# Patient Record
Sex: Female | Born: 1940
Health system: Southern US, Community
[De-identification: ages and names within clinical notes are randomized; demographics above are authoritative.]

## PROBLEM LIST (undated history)

## (undated) DIAGNOSIS — M1990 Unspecified osteoarthritis, unspecified site: Secondary | ICD-10-CM

## (undated) HISTORY — DX: Unspecified osteoarthritis, unspecified site: M19.90

---

## 1997-07-02 ENCOUNTER — Other Ambulatory Visit: Admission: RE | Admit: 1997-07-02 | Discharge: 1997-07-02 | Payer: Self-pay | Admitting: Gastroenterology

## 1997-07-28 ENCOUNTER — Other Ambulatory Visit: Admission: RE | Admit: 1997-07-28 | Discharge: 1997-07-28 | Payer: Self-pay | Admitting: Gastroenterology

## 1997-07-29 ENCOUNTER — Other Ambulatory Visit: Admission: RE | Admit: 1997-07-29 | Discharge: 1997-07-29 | Payer: Self-pay | Admitting: Gastroenterology

## 1997-08-07 ENCOUNTER — Ambulatory Visit (HOSPITAL_COMMUNITY): Admission: RE | Admit: 1997-08-07 | Discharge: 1997-08-07 | Payer: Self-pay | Admitting: Gastroenterology

## 1997-08-11 ENCOUNTER — Other Ambulatory Visit: Admission: RE | Admit: 1997-08-11 | Discharge: 1997-08-11 | Payer: Self-pay | Admitting: Family Medicine

## 1997-08-12 ENCOUNTER — Other Ambulatory Visit: Admission: RE | Admit: 1997-08-12 | Discharge: 1997-08-12 | Payer: Self-pay | Admitting: Gastroenterology

## 1997-08-25 ENCOUNTER — Other Ambulatory Visit: Admission: RE | Admit: 1997-08-25 | Discharge: 1997-08-25 | Payer: Self-pay | Admitting: Gastroenterology

## 1997-09-22 ENCOUNTER — Other Ambulatory Visit: Admission: RE | Admit: 1997-09-22 | Discharge: 1997-09-22 | Payer: Self-pay | Admitting: Gastroenterology

## 1997-10-20 ENCOUNTER — Other Ambulatory Visit: Admission: RE | Admit: 1997-10-20 | Discharge: 1997-10-20 | Payer: Self-pay | Admitting: Gastroenterology

## 1997-11-20 ENCOUNTER — Ambulatory Visit (HOSPITAL_COMMUNITY): Admission: RE | Admit: 1997-11-20 | Discharge: 1997-11-20 | Payer: Self-pay | Admitting: Gastroenterology

## 1997-11-20 ENCOUNTER — Encounter: Payer: Self-pay | Admitting: Gastroenterology

## 1998-08-19 ENCOUNTER — Encounter: Payer: Self-pay | Admitting: Family Medicine

## 1998-08-19 ENCOUNTER — Ambulatory Visit (HOSPITAL_COMMUNITY): Admission: RE | Admit: 1998-08-19 | Discharge: 1998-08-19 | Payer: Self-pay | Admitting: Family Medicine

## 1998-09-28 ENCOUNTER — Other Ambulatory Visit: Admission: RE | Admit: 1998-09-28 | Discharge: 1998-09-28 | Payer: Self-pay | Admitting: Family Medicine

## 1998-10-25 ENCOUNTER — Ambulatory Visit (HOSPITAL_COMMUNITY): Admission: RE | Admit: 1998-10-25 | Discharge: 1998-10-25 | Payer: Self-pay | Admitting: Family Medicine

## 1999-07-14 ENCOUNTER — Encounter: Payer: Self-pay | Admitting: Gastroenterology

## 1999-07-14 ENCOUNTER — Encounter: Admission: RE | Admit: 1999-07-14 | Discharge: 1999-07-14 | Payer: Self-pay | Admitting: Gastroenterology

## 1999-10-27 ENCOUNTER — Encounter: Payer: Self-pay | Admitting: Family Medicine

## 1999-10-27 ENCOUNTER — Ambulatory Visit (HOSPITAL_COMMUNITY): Admission: RE | Admit: 1999-10-27 | Discharge: 1999-10-27 | Payer: Self-pay | Admitting: Family Medicine

## 2001-05-05 ENCOUNTER — Encounter: Payer: Self-pay | Admitting: *Deleted

## 2001-05-05 ENCOUNTER — Ambulatory Visit (HOSPITAL_COMMUNITY): Admission: RE | Admit: 2001-05-05 | Discharge: 2001-05-05 | Payer: Self-pay | Admitting: *Deleted

## 2001-06-07 ENCOUNTER — Ambulatory Visit (HOSPITAL_COMMUNITY): Admission: RE | Admit: 2001-06-07 | Discharge: 2001-06-07 | Payer: Self-pay | Admitting: Family Medicine

## 2001-07-17 ENCOUNTER — Ambulatory Visit (HOSPITAL_COMMUNITY): Admission: RE | Admit: 2001-07-17 | Discharge: 2001-07-17 | Payer: Self-pay | Admitting: Gastroenterology

## 2001-07-17 ENCOUNTER — Encounter (INDEPENDENT_AMBULATORY_CARE_PROVIDER_SITE_OTHER): Payer: Self-pay | Admitting: *Deleted

## 2002-10-20 ENCOUNTER — Other Ambulatory Visit: Admission: RE | Admit: 2002-10-20 | Discharge: 2002-10-20 | Payer: Self-pay | Admitting: Family Medicine

## 2004-02-24 ENCOUNTER — Ambulatory Visit: Payer: Self-pay | Admitting: Internal Medicine

## 2004-03-04 ENCOUNTER — Ambulatory Visit: Payer: Self-pay | Admitting: Internal Medicine

## 2004-03-09 ENCOUNTER — Ambulatory Visit (HOSPITAL_COMMUNITY): Admission: RE | Admit: 2004-03-09 | Discharge: 2004-03-09 | Payer: Self-pay | Admitting: Internal Medicine

## 2004-03-14 ENCOUNTER — Ambulatory Visit: Payer: Self-pay | Admitting: Internal Medicine

## 2004-07-20 ENCOUNTER — Ambulatory Visit: Payer: Self-pay | Admitting: Family Medicine

## 2004-08-12 ENCOUNTER — Ambulatory Visit: Payer: Self-pay | Admitting: Family Medicine

## 2004-11-09 ENCOUNTER — Ambulatory Visit: Payer: Self-pay | Admitting: Internal Medicine

## 2004-11-30 ENCOUNTER — Ambulatory Visit: Payer: Self-pay | Admitting: Internal Medicine

## 2004-12-20 ENCOUNTER — Ambulatory Visit: Payer: Self-pay | Admitting: Internal Medicine

## 2004-12-21 ENCOUNTER — Ambulatory Visit (HOSPITAL_COMMUNITY): Admission: RE | Admit: 2004-12-21 | Discharge: 2004-12-21 | Payer: Self-pay | Admitting: Internal Medicine

## 2005-04-15 ENCOUNTER — Emergency Department (HOSPITAL_COMMUNITY): Admission: EM | Admit: 2005-04-15 | Discharge: 2005-04-15 | Payer: Self-pay | Admitting: Family Medicine

## 2005-05-02 ENCOUNTER — Ambulatory Visit: Payer: Self-pay | Admitting: Family Medicine

## 2005-06-03 ENCOUNTER — Encounter (INDEPENDENT_AMBULATORY_CARE_PROVIDER_SITE_OTHER): Payer: Self-pay | Admitting: Nurse Practitioner

## 2005-06-03 LAB — CONVERTED CEMR LAB

## 2005-06-20 ENCOUNTER — Encounter (INDEPENDENT_AMBULATORY_CARE_PROVIDER_SITE_OTHER): Payer: Self-pay | Admitting: Family Medicine

## 2005-06-20 ENCOUNTER — Other Ambulatory Visit: Admission: RE | Admit: 2005-06-20 | Discharge: 2005-06-20 | Payer: Self-pay | Admitting: Family Medicine

## 2005-06-20 ENCOUNTER — Ambulatory Visit: Payer: Self-pay | Admitting: Family Medicine

## 2005-06-22 ENCOUNTER — Ambulatory Visit (HOSPITAL_COMMUNITY): Admission: RE | Admit: 2005-06-22 | Discharge: 2005-06-22 | Payer: Self-pay | Admitting: Family Medicine

## 2006-02-09 ENCOUNTER — Ambulatory Visit: Payer: Self-pay | Admitting: Internal Medicine

## 2006-02-13 ENCOUNTER — Ambulatory Visit: Payer: Self-pay | Admitting: *Deleted

## 2006-04-02 ENCOUNTER — Ambulatory Visit: Payer: Self-pay | Admitting: Family Medicine

## 2006-04-04 ENCOUNTER — Ambulatory Visit (HOSPITAL_COMMUNITY): Admission: RE | Admit: 2006-04-04 | Discharge: 2006-04-04 | Payer: Self-pay | Admitting: Family Medicine

## 2006-04-10 ENCOUNTER — Ambulatory Visit (HOSPITAL_COMMUNITY): Admission: RE | Admit: 2006-04-10 | Discharge: 2006-04-10 | Payer: Self-pay | Admitting: Family Medicine

## 2006-04-10 ENCOUNTER — Encounter (INDEPENDENT_AMBULATORY_CARE_PROVIDER_SITE_OTHER): Payer: Self-pay | Admitting: Cardiology

## 2006-04-10 ENCOUNTER — Encounter (INDEPENDENT_AMBULATORY_CARE_PROVIDER_SITE_OTHER): Payer: Self-pay | Admitting: *Deleted

## 2006-04-16 ENCOUNTER — Ambulatory Visit: Payer: Self-pay | Admitting: Family Medicine

## 2006-04-18 ENCOUNTER — Ambulatory Visit: Payer: Self-pay | Admitting: *Deleted

## 2006-06-04 ENCOUNTER — Ambulatory Visit: Payer: Self-pay | Admitting: Family Medicine

## 2006-07-17 ENCOUNTER — Ambulatory Visit (HOSPITAL_COMMUNITY): Admission: RE | Admit: 2006-07-17 | Discharge: 2006-07-17 | Payer: Self-pay | Admitting: Internal Medicine

## 2006-10-17 ENCOUNTER — Ambulatory Visit: Payer: Self-pay | Admitting: Family Medicine

## 2006-12-19 ENCOUNTER — Encounter (INDEPENDENT_AMBULATORY_CARE_PROVIDER_SITE_OTHER): Payer: Self-pay | Admitting: *Deleted

## 2006-12-24 ENCOUNTER — Encounter (INDEPENDENT_AMBULATORY_CARE_PROVIDER_SITE_OTHER): Payer: Self-pay | Admitting: Nurse Practitioner

## 2006-12-24 DIAGNOSIS — M25569 Pain in unspecified knee: Secondary | ICD-10-CM

## 2006-12-24 DIAGNOSIS — K589 Irritable bowel syndrome without diarrhea: Secondary | ICD-10-CM | POA: Insufficient documentation

## 2006-12-24 DIAGNOSIS — K219 Gastro-esophageal reflux disease without esophagitis: Secondary | ICD-10-CM

## 2006-12-24 DIAGNOSIS — H53469 Homonymous bilateral field defects, unspecified side: Secondary | ICD-10-CM

## 2006-12-24 DIAGNOSIS — B182 Chronic viral hepatitis C: Secondary | ICD-10-CM | POA: Insufficient documentation

## 2007-03-08 ENCOUNTER — Ambulatory Visit: Payer: Self-pay | Admitting: Nurse Practitioner

## 2007-03-08 DIAGNOSIS — B373 Candidiasis of vulva and vagina: Secondary | ICD-10-CM

## 2007-03-08 DIAGNOSIS — M940 Chondrocostal junction syndrome [Tietze]: Secondary | ICD-10-CM | POA: Insufficient documentation

## 2007-03-08 LAB — CONVERTED CEMR LAB
ALT: 31 units/L (ref 0–35)
Albumin: 4.3 g/dL (ref 3.5–5.2)
BUN: 15 mg/dL (ref 6–23)
Basophils Absolute: 0 10*3/uL (ref 0.0–0.1)
Chloride: 108 meq/L (ref 96–112)
Creatinine, Ser: 0.79 mg/dL (ref 0.40–1.20)
Eosinophils Absolute: 0.2 10*3/uL (ref 0.2–0.7)
LDL Cholesterol: 126 mg/dL — ABNORMAL HIGH (ref 0–99)
Lymphocytes Relative: 37 % (ref 12–46)
MCHC: 32.2 g/dL (ref 30.0–36.0)
Monocytes Absolute: 0.3 10*3/uL (ref 0.1–1.0)
Neutro Abs: 1.9 10*3/uL (ref 1.7–7.7)
Neutrophils Relative %: 49 % (ref 43–77)
Platelets: 171 10*3/uL (ref 150–400)
Potassium: 4.8 meq/L (ref 3.5–5.3)
RBC: 5.11 M/uL (ref 3.87–5.11)
Total Bilirubin: 0.8 mg/dL (ref 0.3–1.2)
Total Protein: 7.8 g/dL (ref 6.0–8.3)
Triglycerides: 81 mg/dL (ref <150)
WBC: 3.8 10*3/uL — ABNORMAL LOW (ref 4.0–10.5)

## 2007-04-30 ENCOUNTER — Encounter (INDEPENDENT_AMBULATORY_CARE_PROVIDER_SITE_OTHER): Payer: Self-pay | Admitting: Family Medicine

## 2007-04-30 ENCOUNTER — Other Ambulatory Visit: Admission: RE | Admit: 2007-04-30 | Discharge: 2007-04-30 | Payer: Self-pay | Admitting: Family Medicine

## 2007-04-30 ENCOUNTER — Ambulatory Visit: Payer: Self-pay | Admitting: Family Medicine

## 2007-04-30 DIAGNOSIS — H6060 Unspecified chronic otitis externa, unspecified ear: Secondary | ICD-10-CM

## 2007-04-30 LAB — CONVERTED CEMR LAB
Ketones, urine, test strip: NEGATIVE
Nitrite: NEGATIVE
Protein, U semiquant: NEGATIVE
Specific Gravity, Urine: <1.005
Urobilinogen, UA: 0.2
pH: 6.5

## 2007-05-02 LAB — CONVERTED CEMR LAB: Pap Smear: NORMAL

## 2007-05-06 ENCOUNTER — Ambulatory Visit (HOSPITAL_COMMUNITY): Admission: RE | Admit: 2007-05-06 | Discharge: 2007-05-06 | Payer: Self-pay | Admitting: Family Medicine

## 2007-05-06 ENCOUNTER — Encounter (INDEPENDENT_AMBULATORY_CARE_PROVIDER_SITE_OTHER): Payer: Self-pay | Admitting: Family Medicine

## 2007-05-21 ENCOUNTER — Encounter (INDEPENDENT_AMBULATORY_CARE_PROVIDER_SITE_OTHER): Payer: Self-pay | Admitting: Family Medicine

## 2007-05-21 DIAGNOSIS — K921 Melena: Secondary | ICD-10-CM | POA: Insufficient documentation

## 2007-05-31 ENCOUNTER — Telehealth (INDEPENDENT_AMBULATORY_CARE_PROVIDER_SITE_OTHER): Payer: Self-pay | Admitting: Family Medicine

## 2007-06-12 ENCOUNTER — Ambulatory Visit: Payer: Self-pay | Admitting: Family Medicine

## 2007-06-12 DIAGNOSIS — M810 Age-related osteoporosis without current pathological fracture: Secondary | ICD-10-CM | POA: Insufficient documentation

## 2007-06-19 ENCOUNTER — Encounter (INDEPENDENT_AMBULATORY_CARE_PROVIDER_SITE_OTHER): Payer: Self-pay | Admitting: Family Medicine

## 2007-07-22 ENCOUNTER — Ambulatory Visit (HOSPITAL_COMMUNITY): Admission: RE | Admit: 2007-07-22 | Discharge: 2007-07-22 | Payer: Self-pay | Admitting: Family Medicine

## 2007-11-05 ENCOUNTER — Ambulatory Visit: Payer: Self-pay | Admitting: Internal Medicine

## 2007-11-05 DIAGNOSIS — M6740 Ganglion, unspecified site: Secondary | ICD-10-CM

## 2007-11-05 DIAGNOSIS — M25519 Pain in unspecified shoulder: Secondary | ICD-10-CM | POA: Insufficient documentation

## 2007-11-05 DIAGNOSIS — R002 Palpitations: Secondary | ICD-10-CM | POA: Insufficient documentation

## 2007-11-08 ENCOUNTER — Encounter (INDEPENDENT_AMBULATORY_CARE_PROVIDER_SITE_OTHER): Payer: Self-pay | Admitting: Internal Medicine

## 2007-12-11 ENCOUNTER — Telehealth (INDEPENDENT_AMBULATORY_CARE_PROVIDER_SITE_OTHER): Payer: Self-pay | Admitting: *Deleted

## 2008-01-16 ENCOUNTER — Ambulatory Visit: Payer: Self-pay | Admitting: Internal Medicine

## 2008-01-20 ENCOUNTER — Encounter (INDEPENDENT_AMBULATORY_CARE_PROVIDER_SITE_OTHER): Payer: Self-pay | Admitting: *Deleted

## 2008-01-20 LAB — CONVERTED CEMR LAB
Free T4: 1.32 ng/dL (ref 0.89–1.80)
T3, Free: 3 pg/mL (ref 2.3–4.2)

## 2008-02-11 ENCOUNTER — Ambulatory Visit: Payer: Self-pay | Admitting: Family Medicine

## 2008-08-24 ENCOUNTER — Ambulatory Visit: Payer: Self-pay | Admitting: Family Medicine

## 2008-08-24 DIAGNOSIS — S93409A Sprain of unspecified ligament of unspecified ankle, initial encounter: Secondary | ICD-10-CM | POA: Insufficient documentation

## 2008-08-24 DIAGNOSIS — S336XXA Sprain of sacroiliac joint, initial encounter: Secondary | ICD-10-CM | POA: Insufficient documentation

## 2008-08-24 DIAGNOSIS — H60339 Swimmer's ear, unspecified ear: Secondary | ICD-10-CM | POA: Insufficient documentation

## 2008-08-24 DIAGNOSIS — R358 Other polyuria: Secondary | ICD-10-CM

## 2008-08-24 LAB — CONVERTED CEMR LAB
Bilirubin Urine: NEGATIVE
Blood in Urine, dipstick: NEGATIVE
Protein, U semiquant: NEGATIVE
Urobilinogen, UA: 0.2

## 2008-08-31 ENCOUNTER — Telehealth (INDEPENDENT_AMBULATORY_CARE_PROVIDER_SITE_OTHER): Payer: Self-pay | Admitting: *Deleted

## 2008-09-09 ENCOUNTER — Encounter (INDEPENDENT_AMBULATORY_CARE_PROVIDER_SITE_OTHER): Payer: Self-pay | Admitting: Nurse Practitioner

## 2008-09-10 ENCOUNTER — Encounter (INDEPENDENT_AMBULATORY_CARE_PROVIDER_SITE_OTHER): Payer: Self-pay | Admitting: Internal Medicine

## 2008-09-22 ENCOUNTER — Encounter (INDEPENDENT_AMBULATORY_CARE_PROVIDER_SITE_OTHER): Payer: Self-pay | Admitting: Internal Medicine

## 2008-10-01 ENCOUNTER — Encounter (INDEPENDENT_AMBULATORY_CARE_PROVIDER_SITE_OTHER): Payer: Self-pay | Admitting: Nurse Practitioner

## 2008-12-28 ENCOUNTER — Encounter (INDEPENDENT_AMBULATORY_CARE_PROVIDER_SITE_OTHER): Payer: Self-pay | Admitting: Nurse Practitioner

## 2009-01-25 ENCOUNTER — Telehealth (INDEPENDENT_AMBULATORY_CARE_PROVIDER_SITE_OTHER): Payer: Self-pay | Admitting: Nurse Practitioner

## 2009-02-16 ENCOUNTER — Encounter (INDEPENDENT_AMBULATORY_CARE_PROVIDER_SITE_OTHER): Payer: Self-pay | Admitting: Nurse Practitioner

## 2009-02-19 ENCOUNTER — Ambulatory Visit: Payer: Self-pay | Admitting: Internal Medicine

## 2009-02-19 DIAGNOSIS — R3915 Urgency of urination: Secondary | ICD-10-CM

## 2009-02-19 LAB — CONVERTED CEMR LAB
Bilirubin Urine: NEGATIVE
Blood in Urine, dipstick: NEGATIVE
Ketones, urine, test strip: NEGATIVE
Nitrite: NEGATIVE
Protein, U semiquant: NEGATIVE
Specific Gravity, Urine: <1.005
Urobilinogen, UA: 0.2
pH: 6

## 2009-02-20 ENCOUNTER — Encounter (INDEPENDENT_AMBULATORY_CARE_PROVIDER_SITE_OTHER): Payer: Self-pay | Admitting: Internal Medicine

## 2009-05-21 ENCOUNTER — Ambulatory Visit: Payer: Self-pay | Admitting: Internal Medicine

## 2009-05-21 DIAGNOSIS — K5900 Constipation, unspecified: Secondary | ICD-10-CM | POA: Insufficient documentation

## 2009-05-21 LAB — CONVERTED CEMR LAB
Glucose, Urine, Semiquant: NEGATIVE
Urobilinogen, UA: 0.2

## 2009-08-17 ENCOUNTER — Ambulatory Visit: Payer: Self-pay | Admitting: Internal Medicine

## 2009-08-17 DIAGNOSIS — R269 Unspecified abnormalities of gait and mobility: Secondary | ICD-10-CM

## 2009-08-28 ENCOUNTER — Telehealth (INDEPENDENT_AMBULATORY_CARE_PROVIDER_SITE_OTHER): Payer: Self-pay | Admitting: Internal Medicine

## 2009-08-28 ENCOUNTER — Encounter (INDEPENDENT_AMBULATORY_CARE_PROVIDER_SITE_OTHER): Payer: Self-pay | Admitting: Internal Medicine

## 2009-09-01 ENCOUNTER — Encounter (INDEPENDENT_AMBULATORY_CARE_PROVIDER_SITE_OTHER): Payer: Self-pay | Admitting: Internal Medicine

## 2009-09-30 ENCOUNTER — Encounter (INDEPENDENT_AMBULATORY_CARE_PROVIDER_SITE_OTHER): Payer: Self-pay | Admitting: *Deleted

## 2009-10-27 ENCOUNTER — Ambulatory Visit: Payer: Self-pay | Admitting: Gastroenterology

## 2009-10-27 ENCOUNTER — Telehealth (INDEPENDENT_AMBULATORY_CARE_PROVIDER_SITE_OTHER): Payer: Self-pay | Admitting: Internal Medicine

## 2009-11-01 ENCOUNTER — Ambulatory Visit: Payer: Self-pay | Admitting: Gastroenterology

## 2009-11-04 ENCOUNTER — Encounter: Payer: Self-pay | Admitting: Gastroenterology

## 2009-12-02 ENCOUNTER — Ambulatory Visit: Payer: Self-pay | Admitting: Internal Medicine

## 2009-12-02 DIAGNOSIS — M25529 Pain in unspecified elbow: Secondary | ICD-10-CM | POA: Insufficient documentation

## 2009-12-10 ENCOUNTER — Ambulatory Visit: Payer: Self-pay | Admitting: Gastroenterology

## 2009-12-28 ENCOUNTER — Encounter
Admission: RE | Admit: 2009-12-28 | Discharge: 2010-03-04 | Payer: Self-pay | Source: Home / Self Care | Admitting: Internal Medicine

## 2009-12-31 ENCOUNTER — Encounter (INDEPENDENT_AMBULATORY_CARE_PROVIDER_SITE_OTHER): Payer: Self-pay | Admitting: Internal Medicine

## 2010-01-11 ENCOUNTER — Ambulatory Visit: Payer: Self-pay | Admitting: Gastroenterology

## 2010-01-28 ENCOUNTER — Telehealth: Payer: Self-pay | Admitting: Gastroenterology

## 2010-02-01 ENCOUNTER — Telehealth: Payer: Self-pay | Admitting: Gastroenterology

## 2010-02-07 ENCOUNTER — Encounter (INDEPENDENT_AMBULATORY_CARE_PROVIDER_SITE_OTHER): Payer: Self-pay | Admitting: Internal Medicine

## 2010-03-04 ENCOUNTER — Encounter (INDEPENDENT_AMBULATORY_CARE_PROVIDER_SITE_OTHER): Payer: Self-pay | Admitting: Internal Medicine

## 2010-03-14 ENCOUNTER — Encounter (INDEPENDENT_AMBULATORY_CARE_PROVIDER_SITE_OTHER): Payer: Self-pay | Admitting: Internal Medicine

## 2010-03-22 ENCOUNTER — Telehealth (INDEPENDENT_AMBULATORY_CARE_PROVIDER_SITE_OTHER): Payer: Self-pay | Admitting: Internal Medicine

## 2010-04-05 ENCOUNTER — Ambulatory Visit
Admission: RE | Admit: 2010-04-05 | Discharge: 2010-04-05 | Payer: Self-pay | Source: Home / Self Care | Attending: Internal Medicine | Admitting: Internal Medicine

## 2010-04-05 DIAGNOSIS — R071 Chest pain on breathing: Secondary | ICD-10-CM | POA: Insufficient documentation

## 2010-04-05 DIAGNOSIS — M549 Dorsalgia, unspecified: Secondary | ICD-10-CM | POA: Insufficient documentation

## 2010-04-07 ENCOUNTER — Telehealth (INDEPENDENT_AMBULATORY_CARE_PROVIDER_SITE_OTHER): Payer: Self-pay | Admitting: Internal Medicine

## 2010-05-01 LAB — CONVERTED CEMR LAB
Albumin: 4.3 g/dL (ref 3.5–5.2)
Alkaline Phosphatase: 46 units/L (ref 39–117)
Basophils Relative: 0 % (ref 0–1)
CO2: 25 meq/L (ref 19–32)
Calcium: 9 mg/dL (ref 8.4–10.5)
Creatinine, Ser: 0.87 mg/dL (ref 0.40–1.20)
Eosinophils Absolute: 0.2 10*3/uL (ref 0.0–0.7)
Eosinophils Relative: 4 % (ref 0–5)
Glucose, Bld: 75 mg/dL (ref 70–99)
Hemoglobin: 14.5 g/dL (ref 12.0–15.0)
Lymphocytes Relative: 41 % (ref 12–46)
Lymphs Abs: 2.1 10*3/uL (ref 0.7–4.0)
MCV: 85.3 fL (ref 78.0–100.0)
Monocytes Relative: 11 % (ref 3–12)
Neutro Abs: 2.3 10*3/uL (ref 1.7–7.7)
Potassium: 4.7 meq/L (ref 3.5–5.3)
RDW: 13.1 % (ref 11.5–15.5)
TSH: 5.885 microintl units/mL — ABNORMAL HIGH (ref 0.350–4.50)
Total Bilirubin: 0.5 mg/dL (ref 0.3–1.2)
Total Protein: 7.5 g/dL (ref 6.0–8.3)
WBC: 5.2 10*3/uL (ref 4.0–10.5)

## 2010-05-03 ENCOUNTER — Encounter
Admission: RE | Admit: 2010-05-03 | Discharge: 2010-05-03 | Payer: Self-pay | Source: Home / Self Care | Attending: Internal Medicine | Admitting: Internal Medicine

## 2010-05-03 NOTE — Procedures (Signed)
Summary: Upper Endoscopy  Patient: Karen Wilson Note: All result statuses are Final unless otherwise noted.  Tests: (1) Upper Endoscopy (EGD)   EGD Upper Endoscopy       DONE     [facility]     [address]     [place], [place]  [address]           ENDOSCOPY PROCEDURE REPORT           PATIENT:  Karen Wilson, Karen Wilson  MR#:  [id number]     BIRTHDATE:  10/03/1940, 69 yrs. old  GENDER:  female           ENDOSCOPIST:  Barbette Hair. Arlyce Dice, MD     Referred by:           PROCEDURE DATE:  11/01/2009     PROCEDURE:  EGD with biopsy     ASA CLASS:  Class II     INDICATIONS:  reflux symptoms despite therapy           MEDICATIONS:   Fentanyl 75 mcg IV, Versed 10 mg IV, glycopyrrolate     (Robinal) 0.2 mg IV, 0.6cc simethancone 0.6 cc PO     TOPICAL ANESTHETIC:  Exactacain Spray           DESCRIPTION OF PROCEDURE:   After the risks benefits and     alternatives of the procedure were thoroughly explained, informed     consent was obtained.  The LB GIF-H180 [id number] endoscope was     introduced through the mouth and advanced to the third portion of     the duodenum, without limitations.  The instrument was slowly     withdrawn as the mucosa was fully examined.     <<PROCEDUREIMAGES>>           irregular Z-line at the gastroesophageal junction. Z line at 36cm     from incisors, irregular (see image1, image2, and image3). Bxs     taken to r/o Barrett's esophagus  Otherwise the examination was     normal.    Retroflexed views revealed no abnormalities.    The     scope was then withdrawn from the patient and the procedure     completed.           COMPLICATIONS:  None           ENDOSCOPIC IMPRESSION:     1) Irregular Z-line at the gastroesophageal junction     2) Otherwise normal examination     RECOMMENDATIONS:     1) Call office next 2-3 days to schedule an office appointment     for 1 month     2) dexilant 60mg  daily           REPEAT EXAM:  Pending biopsy results          ______________________________     Barbette Hair. Arlyce Dice, MD           CC:  Julieanne Manson, MD           n.     Rosalie DoctorBarbette Hair. Mervin Ramires at 11/01/2009 04:22 PM           Ochs, Marko Stai, [phone]  Note: An exclamation mark (!) indicates a result that was not dispersed into the flowsheet. Document Creation Date: 11/01/2009 4:22 PM _______________________________________________________________________  (1) Order result status: Final Collection or observation date-time: 11/01/2009 16:14 Requested date-time:  Receipt date-time:  Reported date-time:  Referring Physician:   Ordering Physician: Molly Maduro  Arlyce Dice [phone]) Specimen Source:  Source: Launa Grill Order Number: [id number] Lab site:

## 2010-05-03 NOTE — Procedures (Signed)
Summary: Colonoscopy  Patient: [name] Note: All result statuses are Final unless otherwise noted.  Tests: (1) Colonoscopy (COL)   COL Colonoscopy           DONE     [facility]     [address]     [place], [place]  [address]           COLONOSCOPY PROCEDURE REPORT           PATIENT:  Karen Wilson, Karen Wilson  MR#:  [id number]     BIRTHDATE:  [birth year], 69 yrs. old  GENDER:  female           ENDOSCOPIST:  Barbette Hair. Arlyce Dice, MD     Referred by:           PROCEDURE DATE:  12/10/2009     PROCEDURE:  Diagnostic Colonoscopy     ASA CLASS:  Class II     INDICATIONS:  1) Routine Risk Screening           MEDICATIONS:   Fentanyl 75 mcg IV, Versed 7 mg IV           DESCRIPTION OF PROCEDURE:   After the risks benefits and     alternatives of the procedure were thoroughly explained, informed     consent was obtained.  Digital rectal exam was performed and     revealed no abnormalities.   The LB CF-H180AL [id number] endoscope     was introduced through the anus and advanced to the cecum, which     was identified by both the appendix and ileocecal valve, without     limitations.  The quality of the prep was excellent, using     MoviPrep.  The instrument was then slowly withdrawn as the colon     was fully examined.     <<PROCEDUREIMAGES>>           FINDINGS:  A normal appearing cecum, ileocecal valve, and     appendiceal orifice were identified. The ascending, hepatic     flexure, transverse, splenic flexure, descending, sigmoid colon,     and rectum appeared unremarkable (see image1, image2, image4,     image5, image8, image9, image11, and image12).   Retroflexed views     in the rectum revealed no abnormalities.    The time to cecum =     5.75  minutes. The scope was then withdrawn (time =  6.25  min)     from the patient and the procedure completed.           COMPLICATIONS:  None           ENDOSCOPIC IMPRESSION:     1) Normal colon     RECOMMENDATIONS:     1)  Continue current colorectal screening recommendations for     "routine risk" patients with a repeat colonoscopy in 10 years.           REPEAT EXAM:  In 10 year(s) for Colonoscopy.           ______________________________     Barbette Hair. Arlyce Dice, MD           CC: Julieanne Manson, MD           n.     Rosalie DoctorBarbette Hair. Haasini Patnaude at 12/10/2009 02:15 PM           Tyree, Marko Stai, [id number]  Note: An exclamation mark (!) indicates a result that was not dispersed into the flowsheet. Document  Creation Date: 12/10/2009 2:16 PM _______________________________________________________________________  (1) Order result status: Final Collection or observation date-time: 12/10/2009 14:08 Requested date-time:  Receipt date-time:  Reported date-time:  Referring Physician:   Ordering Physician: Melvia Heaps [phone]) Specimen Source:  Source: Launa Grill Order Number: [id number] Lab site:   Appended Document: Colonoscopy    Clinical Lists Changes  Observations: Added new observation of COLONNXTDUE: 12/2019 (12/10/2009 14:35)

## 2010-05-03 NOTE — Miscellaneous (Addendum)
Summary: Rehab Report//initial summary  Rehab Report//initial summary   Imported By: Arta Bruce 12/31/2009 14:07:07  _____________________________________________________________________  External Attachments:     1. Type:   Image          Comment:   External Document    2. Type:   Image          Comment:   External Document

## 2010-05-03 NOTE — Letter (Signed)
Summary: New Patient letter  [name]  [address] [place], [place] [address]   Phone: [phone]  Fax: [phone]       09/30/2009 MRN: [id number]  Karen Wilson [address] [place], [place]  [address]  Dear Karen Wilson,  Welcome to the Gastroenterology Division at [facility].    You are scheduled to see Dr.  Melvia Heaps on October 25, 2009 at 10:45am on the 3rd floor at [facility], [address]  We ask that you try to arrive at our office 15 minutes prior to your appointment time to allow for check-in.  We would like you to complete the enclosed self-administered evaluation form prior to your visit and bring it with you on the day of your appointment.  We will review it with you.  Also, please bring a complete list of all your medications or, if you prefer, bring the medication bottles and we will list them.  Please bring your insurance card so that we may make a copy of it.  If your insurance requires a referral to see a specialist, please bring your referral form from your primary care physician.  Co-payments are due at the time of your visit and may be paid by cash, check or credit card.     Your office visit will consist of a consult with your physician (includes a physical exam), any laboratory testing he/she may order, scheduling of any necessary diagnostic testing (e.g. x-ray, ultrasound, CT-scan), and scheduling of a procedure (e.g. Endoscopy, Colonoscopy) if required.  Please allow enough time on your schedule to allow for any/all of these possibilities.    If you cannot keep your appointment, please call [phone] to cancel or reschedule prior to your appointment date.  This allows [place] the opportunity to schedule an appointment for another patient in need of care.  If you do not cancel or reschedule by 5 p.m. the business day prior to your appointment date, you will be charged a $50.00 late cancellation/no-show fee.    Thank  you for choosing [facility] for your medical needs.  We appreciate the opportunity to care for you.  Please visit [place] at our website  to learn more about our practice.                     Sincerely,                                                             The Gastroenterology Division

## 2010-05-03 NOTE — Progress Notes (Signed)
Summary: Refill Request  Medications Added PEPCID 20 MG TABS (FAMOTIDINE) Take 2 tabs by mouth at bedtime       Phone Note Call from Patient Call back at [phone]   Summary of Call: The daughter of the pt called because the pharmacy still has not received the refill request sucralfate.  According to the pt, the pharmacy had been waiting for whole month without having an answer.  Please check and find out what is going on? ([facility] Initial call taken by: Manon Hilding,  October 27, 2009 12:30 PM  Follow-up for Phone Call        I called the pharmacy -- sucralfate has been on back-order for several months and only brand name is available, which is expensive.  Advised daughter -- she would like an alternative med, if possible, sent to [facility]. Follow-up by: Dutch Quint RN,  October 27, 2009 12:59 PM  Additional Follow-up for Phone Call Additional follow up Details #1::        She can add Pepcid 40 mg at bedtime in lieu of the carafate. Does she have an appointment with GI soon? Rx in [place]'s basket to fax to her pharmacy. Additional Follow-up by: Tereso Newcomer PA-C,  October 27, 2009 2:10 PM    Additional Follow-up for Phone Call Additional follow up Details #2::    Advised pt.'s daughter of new faxed Rx.  States GI appt. is this afternoon.  Dutch Quint RN  October 27, 2009 2:46 PM   New/Updated Medications: PEPCID 20 MG TABS (FAMOTIDINE) Take 2 tabs by mouth at bedtime Prescriptions: PEPCID 20 MG TABS (FAMOTIDINE) Take 2 tabs by mouth at bedtime  #60 x 2   Entered and Authorized by:   Tereso Newcomer PA-C   Signed by:   Tereso Newcomer PA-C on 10/27/2009   Method used:   Printed then faxed to ...       [facility] [address] [phone]* (retail)       [address]       [place], [place]  [address]       Ph: [phone] or [phone]       Fax: [phone]   RxID:   [id number]

## 2010-05-03 NOTE — Progress Notes (Signed)
Summary: Protonix not covered   Phone Note Outgoing Call Call back at [facility] Phone [phone]   Call placed by: Merri Ray CMA Duncan Dull),  January 28, 2010 9:08 AM Summary of Call: Called pt to inform we got a denial from pharmacy on Protonix, her insurance will not cover. She needs to be on either on Lansoprazole or Omeprazole. Waiting on pt to return my call Initial call taken by: Merri Ray CMA Duncan Dull),  January 28, 2010 9:09 AM  Follow-up for Phone Call        Called pt again No Answer L/M for her to R/C again Follow-up by: Merri Ray CMA Duncan Dull),  January 31, 2010 10:33 AM  Additional Follow-up for Phone Call Additional follow up Details #1::        tried to contact pt Still no answer...will wait for pt to return call before any medication is sent. Additional Follow-up by: Merri Ray CMA Duncan Dull),  January 31, 2010 4:26 PM

## 2010-05-03 NOTE — Letter (Signed)
Summary: EGD Instructions  [facility]  [address] [place], [place] [address]   Phone: [phone]  Fax: [phone]       Karen Wilson    15-Aug-1940    MRN: [id number]       Procedure Day Dorna Bloom  [facility] 11/01/2009     Arrival Time:2:30PM     Procedure Time:3:30PM     Location of Procedure:                    X   Endoscopy Center (4th Floor)    PREPARATION FOR ENDOSCOPY   On8/04/2009 THE DAY OF THE PROCEDURE:  1.   No solid foods, milk or milk products are allowed after midnight the night before your procedure.  2.   Do not drink anything colored red or purple.  Avoid juices with pulp.  No orange juice.  3.  You may drink clear liquids until1:30PM, which is 2 hours before your procedure.                                                                                                CLEAR LIQUIDS INCLUDE: Water Jello Ice Popsicles Tea (sugar ok, no milk/cream) Powdered fruit flavored drinks Coffee (sugar ok, no milk/cream) Gatorade Juice: apple, white grape, white cranberry  Lemonade Clear bullion, consomm, broth Carbonated beverages (any kind) Strained chicken noodle soup Hard Candy   MEDICATION INSTRUCTIONS  Unless otherwise instructed, you should take regular prescription medications with a small sip of water as early as possible the morning of your procedure.            OTHER INSTRUCTIONS  You will need a responsible adult at least 68 years of age to accompany you and drive you home.   This person must remain in the waiting room during your procedure.  Wear loose fitting clothing that is easily removed.  Leave jewelry and other valuables at home.  However, you may wish to bring a book to read or an iPod/MP3 player to listen to music as you wait for your procedure to start.  Remove all body piercing jewelry and leave at home.  Total time from sign-in until discharge is approximately 2-3 hours.  You should go home  directly after your procedure and rest.  You can resume normal activities the day after your procedure.  The day of your procedure you should not:   Drive   Make legal decisions   Operate machinery   Drink alcohol   Return to work  You will receive specific instructions about eating, activities and medications before you leave.    The above instructions have been reviewed and explained to me by   _______________________    I fully understand and can verbalize these instructions _____________________________ Date _________

## 2010-05-03 NOTE — Op Note (Signed)
Summary: colonoscopy done by Pricilla Larsson. [facility]  Patient:    Karen Wilson, Karen Wilson Visit Number: [id number] MRN: [id number]          Service Type: END Location: ENDO Attending Physician:  Orland Mustard Dictated by:   Llana Aliment. Randa Evens, M.D. Proc. Date: 07/17/01 Admit Date:  07/17/2001   CC:         Barton Fanny, [facility]   Procedure Report  PROCEDURE:  Colonoscopy.  MEDICATIONS:  Fentanyl 100 mcg, Versed 10 mg IV.  INDICATION:  Rectal bleeding.  SCOPE:  Olympus pediatric video colonoscope.  DESCRIPTION OF PROCEDURE:  The procedure had been explained to the patient and consent obtained.  With the patient in the left lateral decubitus position, the Olympus pediatric video colonoscope inserted and advanced under direct visualization.  The prep was quite good.  Using abdominal pressure and position changes, we were able to reach the cecum.  The ileocecal valve was clearly seen, as was the appendiceal orifice.  Scope withdrawn.  Cecum, ascending colon, hepatic flexure, transverse colon, splenic flexure, descending, and sigmoid colon were seen well upon removal.  No polyps or other lesions seen.  The scope was withdrawn down into the splenic flexure and descending colon.  The patient has had chronic left upper quadrant quadrant pain, and absolutely nothing could be seen abnormal in this area.  The sigmoid colon was seen well and was normal.  Internal hemorrhoids were seen in the rectum and were normal.  The scope was retroflexed with a finding of internal hemorrhoids.  No other abnormality was seen.  She tolerated the procedure well, was maintained on low-flow oxygen and pulse oximeter throughout the procedure.  ASSESSMENT:  Rectal bleeding probably due to internal hemorrhoids.  PLAN:  Will give hemorrhoid sheet.  Will see back in the office on an as-needed basis. Dictated by:   Llana Aliment. Randa Evens, M.D. Attending Physician:  Orland Mustard DD:  07/17/01 TD:  07/17/01 Job: [id number]/[id number]

## 2010-05-03 NOTE — Assessment & Plan Note (Signed)
Summary: 3 MONTH FU ON URINARY SURGERY//KT   Vital Signs:  Patient profile:   70 year old female Menstrual status:  postmenopausal Weight:      193.8 pounds Temp:     97.8 degrees F  oral Pulse rate:   92 / minute Pulse rhythm:   regular Resp:     18 per minute BP sitting:   121 / 83  (left arm) Cuff size:   large  Vitals Entered By: Geanie Cooley  (May 21, 2009 4:18 PM) CC: Pt here for followup on urine frequency. Pt states shes not not going as much as she was before> Pt goes about 4times throughtout the night. Pt states she has pain going from her back to her chest sometimes.Pt also states she has been constipated the last 2 months. Pain Assessment Patient in pain? no       Does patient need assistance? Functional Status Self care Ambulation Normal     Menstrual Status postmenopausal Last PAP Result normal                                                            CC:  Pt here for followup on urine frequency. Pt states shes not not going as much as she was before> Pt goes about 4times throughtout the night. Pt states she has pain going from her back to her chest sometimes.Pt also states she has been constipated the last 2 months..  History of Present Illness: 1.  Urinary Urge incontinence:  Is taking Enablex.  Still with nocturia 3-4 times nightly--previously was going 6-7 times.  No longer with significant urinary urgency.  Would like to have less night time urination.  2.  Constipation:  Intermittent problems--has been a problem in past 2 months recently.  Can skip a BM for 2 days.  Stool is hard and has to struggle.  Tea, milk, banana for breakfast today.  Lunch:  Rice and bit of beef stew.  Milk.  States she does drink some water.    3.  Gas pain :  right anterior chest to around shoulder blade--aching pain moving around like a snake.  Comes and goes.  No belching with this.  If passes flatus--helps a bit.  If passes a BM, not sure if helps as her BMs are  so infrequent.  Cannot say this has been a problem over the same time as the constipation--thinks it has been for a month.  Can have up to 3 times weekly.  Does describe dysphagia as well--that seems to coincide with the disomfort in chest--gets an acid taste in mouth.  Is taking Omeprazole and Sucralfate.  Also is taking weekly Fosamax.  Cannot say if symptoms worse the day she takes the Fosamax.  Allergies (verified): No Known Drug Allergies  Social History: Lives with daughter. Originally from [place] Never Smoked Alcohol use-no Drug use-no  Physical Exam  Lungs:  Normal respiratory effort, chest expands symmetrically. Lungs are clear to auscultation, no crackles or wheezes. Heart:  Normal rate and regular rhythm. S1 and S2 normal without gallop, murmur, click, rub or other extra sounds.  Radial pulses normal and equal Abdomen:  Bowel sounds positive,abdomen soft and non-tender without masses, organomegaly or hernias noted.   Impression & Recommendations:  Problem # 1:  URINARY  URGENCY (ICD-788.63) Increase Enablex.  Problem # 2:  GERD (ICD-530.81) Worsening symptoms--hold Fosamax and see if improves. May be related to #3 as well. Her updated medication list for this problem includes:    Sucralfate 1 Gm Tabs (Sucralfate) .Marland Kitchen... Take one tablet by mouth three times a day    [facility] Omeprazole 20 Mg Tbec (Omeprazole) .Marland Kitchen... Take 1 tablet by mouth every 12 hours  Problem # 3:  CONSTIPATION (ICD-564.00) Fiber laxative and increased fluid intake  Complete Medication List: 1)  Sucralfate 1 Gm Tabs (Sucralfate) .... Take one tablet by mouth three times a day 2)  [facility] Omeprazole 20 Mg Tbec (Omeprazole) .... Take 1 tablet by mouth every 12 hours 3)  Claritin 10 Mg Tabs (Loratadine) .... Take 1 tablet by mouth once a day as needed allergy symptoms 4)  Nystatin 100000 Unit/gm Crea (Nystatin) .Marland Kitchen.. 1 application to affected area two times a day 5)  Tramadol Hcl 50 Mg Tabs (Tramadol hcl) ....  Take 1-2 tablets by mouth every 8 hours as needed for pain 6)  Cortisporin 3.5-10000-1 Susp (Neomycin-polymyxin-hc) .... Place 3 drops in affected ear every 6 hours for 10 days 7)  Aspirin 81 Mg Tbec (Aspirin) .... One by mouth every day 8)  Oscal 500/200 D-3 500-200 Mg-unit Tabs (Calcium-vitamin d) .... Take one tablet three times a day 9)  Fosamax 70 Mg Tabs (Alendronate sodium) .... Take one tablet each week for bone-building 10)  Flexeril 5 Mg Tabs (Cyclobenzaprine hcl) .Marland Kitchen.. 1 tab by mouth q hs as needed back pain 11)  Voltaren 1 % Gel (Diclofenac sodium) .... Apply 4gm to affected area up to four times per day for pain **pharmacy - discontinue lidoderm patch** 12)  Enablex 7.5 Mg Xr24h-tab (Darifenacin hydrobromide) .... 2 tabs by mouth at bedtime  Patient Instructions: 1)  Citrucel in tall glass of water once daily--even when not constipated. 2)  Hold Fosamax 3)  Follow up with Dr. Delrae Alfred in 2 months --GERD off Fosamax 4)  Increase Enablex to 2 tabs at bedtime Prescriptions: ENABLEX 7.5 MG XR24H-TAB (DARIFENACIN HYDROBROMIDE) 2 tabs by mouth at bedtime  #60 x 4   Entered and Authorized by:   Julieanne Manson MD   Signed by:   Julieanne Manson MD on 05/21/2009   Method used:   [facility]ronically to        [facility] [address] [phone]* (retail)       [address]       [place], [place]  [address]       Ph: [phone] or [phone]       Fax: [phone]   RxID:   [id number]    Vital Signs:  Patient profile:   70 year old female Menstrual status:  postmenopausal Weight:      193.8 pounds Temp:     97.8 degrees F  oral Pulse rate:   92 / minute Pulse rhythm:   regular Resp:     18 per minute BP sitting:   121 / 83  (left arm) Cuff size:   large  Vitals Entered By: Geanie Cooley  (May 21, 2009 4:18 PM)      Laboratory Results   Urine Tests  Date/Time Received: May 21, 2009 4:38 PM   Routine Urinalysis   Color:  yellow Glucose: negative   (Normal Range: Negative) Bilirubin: negative   (Normal Range: Negative) Ketone: negative   (Normal Range: Negative) Spec. Gravity: <1.005   (Normal Range:  1.003-1.035) Blood: trace-lysed   (Normal Range: Negative) pH: 7.0   (Normal Range: 5.0-8.0) Protein: negative   (Normal Range: Negative) Urobilinogen: 0.2   (Normal Range: 0-1) Nitrite: negative   (Normal Range: Negative) Leukocyte Esterace: moderate   (Normal Range: Negative)

## 2010-05-03 NOTE — Assessment & Plan Note (Signed)
Summary: 2 MONTH F/U/////BC   Vital Signs:  Patient profile:   70 year old female Menstrual status:  postmenopausal Weight:      195 pounds BMI:     36.38 Pulse rate:   89 / minute Pulse rhythm:   regular Resp:     16 per minute BP sitting:   127 / 85  (left arm) Cuff size:   large  Vitals Entered By: Vesta Mixer CMA (Aug 17, 2009 4:43 PM) CC: 2 month f/u doing well per daughter.  Sometimes has a bad taste in mouth, worse in the morning. Is Patient Diabetic? No Pain Assessment Patient in pain? yes     Location: left shoulder Intensity: 7  Does patient need assistance? Ambulation Impaired:Risk for fall   CC:  2 month f/u doing well per daughter.  Sometimes has a bad taste in mouth and worse in the morning.Marland Kitchen  History of Present Illness: 1.  GERD:  Stopped Fosamax.  Every morning, has a bad taste in mouth--does not feel secondary to dry mouth. Belching up digested food twice weekly--like rotten eggs..  Burning sensation in chest only once since last seen.  Not having the pain she was having previously. No dysphagia now.   Continues to take Carafate and Omeprazole--the latter bid .  HOB elevated.  Not lying down after eating--for at least 2 hours.  No melena or hematochezia.  Daughter and pt. do not believe she has ever had an EGD  2.  Urinary incontinence:  Enablex increased.  Only having nocturia 2-3 times nightly now--no longer a problem to her.  3.  Constipation:  Citrucel not working.  Drinking 6 glasses of water daily.  Going 2-3 days without a stool and stools are hard.  Does not feel this has worsened since starting Enablex.  Has had this for years.  4.  Left shoulder pain:  has noted for 2 weeks.  Apparently fell in house twice    Allergies (verified): No Known Drug Allergies  Physical Exam  General:  NAD Lungs:  Normal respiratory effort, chest expands symmetrically. Lungs are clear to auscultation, no crackles or wheezes. Heart:  Normal rate and regular rhythm.  S1 and S2 normal without gallop, murmur, click, rub or other extra sounds. Abdomen:  Bowel sounds positive,abdomen soft and non-tender without masses, organomegaly or hernias noted. Extremities:  Tender over subacromial bursa.  Pain on internal rotation and abduction.   Impression & Recommendations:  Problem # 1:  UNSTEADY GAIT (ICD-781.2)  4 pronged cane  Orders: Physical Therapy Referral (PT)  Problem # 2:  SHOULDER PAIN, LEFT (ICD-719.41) Suspect Rotator cuff injury Her updated medication list for this problem includes:    Tramadol Hcl 50 Mg Tabs (Tramadol hcl) .Marland Kitchen... Take 1-2 tablets by mouth every 8 hours as needed for pain    Aspirin 81 Mg Tbec (Aspirin) ..... One by mouth every day    Flexeril 5 Mg Tabs (Cyclobenzaprine hcl) .Marland Kitchen... 1 tab by mouth q hs as needed back pain  Orders: Physical Therapy Referral (PT)  Problem # 3:  CONSTIPATION (ICD-564.00) Continue fiber and water intake Use Miralax regularly, but at dose and frequency that keeps stools soft yet formed Her updated medication list for this problem includes:    Miralax Powd (Polyethylene glycol 3350) .Marland KitchenMarland KitchenMarland KitchenMarland Kitchen 17 g in fluid by mouth daily  Problem # 4:  URINARY URGENCY (KVQ-259.56) Much improved  Problem # 5:  GERD (ICD-530.81) Concerned she is having symptoms of GERD despite regular medications and precautions.  Her updated medication list for this problem includes:    Sucralfate 1 Gm Tabs (Sucralfate) .Marland Kitchen... Take one tablet by mouth three times a day    Cvs Omeprazole 20 Mg Tbec (Omeprazole) .Marland Kitchen... Take 1 tablet by mouth every 12 hours  Orders: Gastroenterology Referral (GI)  Complete Medication List: 1)  Sucralfate 1 Gm Tabs (Sucralfate) .... Take one tablet by mouth three times a day 2)  Cvs Omeprazole 20 Mg Tbec (Omeprazole) .... Take 1 tablet by mouth every 12 hours 3)  Claritin 10 Mg Tabs (Loratadine) .... Take 1 tablet by mouth once a day as needed allergy symptoms 4)  Nystatin 100000 Unit/gm Crea  (Nystatin) .Marland Kitchen.. 1 application to affected area two times a day 5)  Tramadol Hcl 50 Mg Tabs (Tramadol hcl) .... Take 1-2 tablets by mouth every 8 hours as needed for pain 6)  Cortisporin 3.5-10000-1 Susp (Neomycin-polymyxin-hc) .... Place 3 drops in affected ear every 6 hours for 10 days 7)  Aspirin 81 Mg Tbec (Aspirin) .... One by mouth every day 8)  Oscal 500/200 D-3 500-200 Mg-unit Tabs (Calcium-vitamin d) .... Take one tablet three times a day 9)  Fosamax 70 Mg Tabs (Alendronate sodium) .... Take one tablet each week for bone-building 10)  Flexeril 5 Mg Tabs (Cyclobenzaprine hcl) .Marland Kitchen.. 1 tab by mouth q hs as needed back pain 11)  Voltaren 1 % Gel (Diclofenac sodium) .... Apply 4gm to affected area up to four times per day for pain **pharmacy - discontinue lidoderm patch** 12)  Enablex 7.5 Mg Xr24h-tab (Darifenacin hydrobromide) .... 2 tabs by mouth at bedtime 13)  Miralax Powd (Polyethylene glycol 3350) .Marland KitchenMarland KitchenMarland Kitchen 17 g in fluid by mouth daily 14)  Quad [employer] (Misc. devices) .... 719.46 and  781.2  Patient Instructions: 1)  Follow up with Dr. Delrae Alfred in 3 months -4months 2)  Call if you do not hear from PT in next 2 weekws-to call Eugenie Prescriptions: QUAD CANE  MISC (MISC. DEVICES) 719.46 and  781.2  #1 x 0   Entered and Authorized by:   Julieanne Manson MD   Signed by:   Julieanne Manson MD on 08/17/2009   Method used:   Print then Give to Patient   RxID:   [id number] MIRALAX  POWD (POLYETHYLENE GLYCOL 3350) 17 g in fluid by mouth daily  #1 month x 6   Entered and Authorized by:   Julieanne Manson MD   Signed by:   Julieanne Manson MD on 08/17/2009   Method used:   [facility]        [facility] [address] [phone]* (retail)       [address]       [place], [place]  [address]       Ph: [phone] or [phone]       Fax: [phone]   RxID:   [id number]

## 2010-05-03 NOTE — Medication Information (Signed)
Summary: [facility]   Imported By: Arta Bruce 05/26/2009 14:49:18  _____________________________________________________________________  External Attachment:    Type:   Image     Comment:   External Document

## 2010-05-03 NOTE — Letter (Signed)
Summary: [facility] Instructions  [facility]  [address] [place], [place] [address]   Phone: [phone]  Fax: [phone]       Karen Wilson    1940/04/28    MRN: [id number]        Procedure Day /Date:FRIDAY 12/10/2009     Arrival Time:12:30PM     Procedure Time:1:30PM     Location of Procedure:                    X   [facility] (4th Floor)    PREPARATION FOR COLONOSCOPY WITH MOVIPREP   Starting 5 days prior to your procedure 12/05/2009 do not eat nuts, seeds, popcorn, corn, beans, peas,  salads, or any raw vegetables.  Do not take any fiber supplements (e.g. Metamucil, Citrucel, and Benefiber).  THE DAY BEFORE YOUR PROCEDURE         DATE: 12/09/2009 ONG:EXBMWUXL  1.  Drink clear liquids the entire day-NO SOLID FOOD  2.  Do not drink anything colored red or purple.  Avoid juices with pulp.  No orange juice.  3.  Drink at least 64 oz. (8 glasses) of fluid/clear liquids during the day to prevent dehydration and help the prep work efficiently.  CLEAR LIQUIDS INCLUDE: Water Jello Ice Popsicles Tea (sugar ok, no milk/cream) Powdered fruit flavored drinks Coffee (sugar ok, no milk/cream) Gatorade Juice: apple, white grape, white cranberry  Lemonade Clear bullion, consomm, broth Carbonated beverages (any kind) Strained chicken noodle soup Hard Candy                             4.  In the morning, mix first dose of MoviPrep solution:    Empty 1 Pouch A and 1 Pouch B into the disposable container    Add lukewarm drinking water to the top line of the container. Mix to dissolve    Refrigerate (mixed solution should be used within 24 hrs)  5.  Begin drinking the prep at 5:00 p.m. The MoviPrep container is divided by 4 marks.   Every 15 minutes drink the solution down to the next mark (approximately 8 oz) until the full liter is complete.   6.  Follow completed prep with 16 oz of clear liquid of your choice (Nothing red or purple).   Continue to drink clear liquids until bedtime.  7.  Before going to bed, mix second dose of MoviPrep solution:    Empty 1 Pouch A and 1 Pouch B into the disposable container    Add lukewarm drinking water to the top line of the container. Mix to dissolve    Refrigerate  THE DAY OF YOUR PROCEDURE      DATE: 12/10/2009 DAY: FRIDAY  Beginning at 8:30a.m. (5 hours before procedure):         1. Every 15 minutes, drink the solution down to the next mark (approx 8 oz) until the full liter is complete.  2. Follow completed prep with 16 oz. of clear liquid of your choice.    3. You may drink clear liquids until 11:30AM (2 HOURS BEFORE PROCEDURE).   MEDICATION INSTRUCTIONS  Unless otherwise instructed, you should take regular prescription medications with a small sip of water   as early as possible the morning of your procedure.       OTHER INSTRUCTIONS  You will need a responsible adult at least 68 years of age to accompany you and drive you  home.   This person must remain in the waiting room during your procedure.  Wear loose fitting clothing that is easily removed.  Leave jewelry and other valuables at home.  However, you may wish to bring a book to read or  an iPod/MP3 player to listen to music as you wait for your procedure to start.  Remove all body piercing jewelry and leave at home.  Total time from sign-in until discharge is approximately 2-3 hours.  You should go home directly after your procedure and rest.  You can resume normal activities the  day after your procedure.  The day of your procedure you should not:   Drive   Make legal decisions   Operate machinery   Drink alcohol   Return to work  You will receive specific instructions about eating, activities and medications before you leave.    The above instructions have been reviewed and explained to me by   _______________________    I fully understand and can verbalize these instructions  _____________________________ Date _________

## 2010-05-03 NOTE — Miscellaneous (Signed)
  Clinical Lists Changes  Medications: Removed medication of PEPCID 20 MG TABS (FAMOTIDINE) Take 2 tabs by mouth at bedtime Added new medication of DEXILANT 60 MG CPDR (DEXLANSOPRAZOLE) take 1 tab 1/2 before breakfast - Signed Rx of DEXILANT 60 MG CPDR (DEXLANSOPRAZOLE) take 1 tab 1/2 before breakfast;  #30 x 2;  Signed;  Entered by: Louis Meckel MD;  Authorized by: Louis Meckel MD;  Method used: [facility] [address] [phone]*, [address] [place], [place], [place]  [address], Ph: [phone] or [phone], Fax: [phone]    Prescriptions: DEXILANT 60 MG CPDR (DEXLANSOPRAZOLE) take 1 tab 1/2 before breakfast  #30 x 2   Entered and Authorized by:   Louis Meckel MD   Signed by:   Louis Meckel MD on 11/01/2009   Method used:   Electronically to        [facility] [address] [phone]* (retail)       [address]       [place], [place]  [address]       Ph: [phone] or [phone]       Fax: [phone]   RxID:   [id number]

## 2010-05-03 NOTE — Assessment & Plan Note (Signed)
Summary: 3-16MONTH FOLLOW UP///BC   Vital Signs:  Patient profile:   70 year old female Menstrual status:  postmenopausal Height:      61.50 inches Weight:      193.8 pounds Temp:     97.5 degrees F oral Pulse rate:   72 / minute Pulse rhythm:   regular Resp:     14 per minute BP sitting:   130 / 84  (left arm) Cuff size:   regular  Vitals Entered By: Michelle Nasuti (December 02, 2009 4:19 PM) CC: pt presents to [facility] for follow up. pt did go to GI doctor Endo done and colonascopy scheduled for next week Is Patient Diabetic? No Pain Assessment Patient in pain? no       Does patient need assistance? Functional Status Self care Ambulation Impaired:Risk for fall Comments pt ambulates with cane   Primary Care Cait [place]:  Julieanne Manson, MD  CC:  pt presents to [facility] for follow up. pt did go to GI doctor Endo done and colonascopy scheduled for next week.  History of Present Illness: 1.  GERD:  had some mild inflammation on EGD/biopsy--no Barret's.  On Dexilant and daughter states it is being covered with just a small copay.   She is having heartburn symptoms only on a rare occasion with Dexilant.    2.  Osteoporosis:  Pt. was on Fosamax prior to above problems with worsening GERD.  Last DEXA was in 2009. Is taking calcium and Vitamin D two times a day.  Does get out and walk a fair amt. per pt and daughter.  3.  Constipation:  Doing well with Miralax--very happy with this.  Stools are regular and soft.    4.  Unsteady gait and falls:  Did not ever hear from PT and did not get the 4 pronged cane. Has not had anymore falls since spring.  5.  Left shoulder pain:  started after a fall in 5/11.  Shoulder is better now, again did not go to PT.  Her elbow bothers her now as well--aches.    6.  Wants a refill of Neomycin/polymyxin B with hydrocortisone  Current Medications (verified): 1)  Dexilant 60 Mg Cpdr (Dexlansoprazole) .Marland Kitchen.. 1 By Mouth Once Daily 2)  Claritin 10 Mg  Tabs  (Loratadine) .... Take 1 Tablet By Mouth Once A Day As Needed Allergy Symptoms 3)  Tramadol Hcl 50 Mg  Tabs (Tramadol Hcl) .... Take 1-2 Tablets By Mouth Every 8 Hours As Needed For Pain 4)  Aspirin 81 Mg  Tbec (Aspirin) .... One By Mouth Every Day 5)  Oscal 500/200 D-3 500-200 Mg-Unit  Tabs (Calcium-Vitamin D) .... Take One Tablet Three Times A Day 6)  Flexeril 5 Mg  Tabs (Cyclobenzaprine Hcl) .Marland Kitchen.. 1 Tab By Mouth Q Hs As Needed Back Pain 7)  Enablex 7.5 Mg Xr24h-Tab (Darifenacin Hydrobromide) .... 2 Tabs By Mouth At Bedtime 8)  Miralax  Powd (Polyethylene Glycol 3350) .Marland KitchenMarland KitchenMarland Kitchen 17 G in Fluid By Mouth Daily 9)  Quad Cane  Misc (Misc. Devices) .... 719.46 and  781.2 10)  Tylenol 325 Mg Tabs (Acetaminophen) .... As Needed 11)  Dexilant 60 Mg Cpdr (Dexlansoprazole) .... Take 1 Tab 1/2 Before Breakfast  Allergies (verified): No Known Drug Allergies  Physical Exam  General:  NAD Ears:  Canals dry. Lungs:  Normal respiratory effort, chest expands symmetrically. Lungs are clear to auscultation, no crackles or wheezes. Heart:  Normal rate and regular rhythm. S1 and S2 normal without gallop, murmur, click,  rub or other extra sounds.  Radial pulses normal and equal. Neurologic:  some rocking side to side with gait, but fairly stable today.   Impression & Recommendations:  Problem # 1:  ELBOW PAIN, LEFT (ICD-719.42) and should pain as well. Orders: Physical Therapy Referral (PT)  Problem # 2:  UNSTEADY GAIT (ICD-781.2)  Orders: Physical Therapy Referral (PT)  Problem # 3:  OSTEOPOROSIS (ICD-733.00) Holding Fosamax for now. PT to avoid falls and fractures. Recheck DXA in a year and if worse, consider IV bisphosphonates. Her updated medication list for this problem includes:    Oscal 500/200 D-3 500-200 Mg-unit Tabs (Calcium-vitamin d) .Marland Kitchen... Take one tablet three times a day  Orders: Physical Therapy Referral (PT)  Problem # 4:  CONSTIPATION (ICD-564.00) Controlled Her updated medication  list for this problem includes:    Miralax Powd (Polyethylene glycol 3350) .Marland KitchenMarland KitchenMarland KitchenMarland Kitchen 17 g in fluid by mouth daily  Problem # 5:  Request for Cortisporin ear drops To keep Qtips et al out of ears No need for drops currently.  Complete Medication List: 1)  Claritin 10 Mg Tabs (Loratadine) .... Take 1 tablet by mouth once a day as needed allergy symptoms 2)  Tramadol Hcl 50 Mg Tabs (Tramadol hcl) .... Take 1-2 tablets by mouth every 8 hours as needed for pain 3)  Aspirin 81 Mg Tbec (Aspirin) .... One by mouth every day 4)  Oscal 500/200 D-3 500-200 Mg-unit Tabs (Calcium-vitamin d) .... Take one tablet three times a day 5)  Flexeril 5 Mg Tabs (Cyclobenzaprine hcl) .Marland Kitchen.. 1 tab by mouth q hs as needed back pain 6)  Enablex 7.5 Mg Xr24h-tab (Darifenacin hydrobromide) .... 2 tabs by mouth at bedtime 7)  Miralax Powd (Polyethylene glycol 3350) .Marland KitchenMarland KitchenMarland Kitchen 17 g in fluid by mouth daily 8)  [employer] (Misc. devices) .... 719.46 and  781.2 9)  Tylenol 325 Mg Tabs (Acetaminophen) .... As needed 10)  Dexilant 60 Mg Cpdr (Dexlansoprazole) .... Take 1 tab 1/2 before breakfast  Patient Instructions: 1)  Call if you do not hear from Physical therapy in 2 weeks. 2)  Follow up with Dr. Delrae Alfred in 4 months for elbow pain and gait

## 2010-05-03 NOTE — Letter (Signed)
Summary: REFERRAL//PHYSICAL THERAPY  REFERRAL//PHYSICAL THERAPY   Imported By: Arta Bruce 09/01/2009 09:57:16  _____________________________________________________________________  External Attachment:    Type:   Image     Comment:   External Document

## 2010-05-03 NOTE — Progress Notes (Signed)
Summary: Continue Dexilant   Phone Note Call from Patient   Summary of Call: FYI DR Kaplan-Pts daughter called and stated that her mother was doing fine on Dexilant wanted to continue the Dexilant instead of taking the Protonix which the insurance will not cover.  Initial call taken by: Merri Ray CMA Duncan Dull),  February 01, 2010 1:09 PM  Follow-up for Phone Call        ok Follow-up by: Louis Meckel MD,  February 02, 2010 9:34 AM

## 2010-05-03 NOTE — Assessment & Plan Note (Signed)
Summary: gerd...em    History of Present Illness Visit Type: consult  Primary GI MD: Melvia Heaps MD [facility] Primary Provider: Julieanne Manson, MD Requesting Provider: Julieanne Manson, MD  Chief Complaint: GERD  History of Present Illness:   [name] is a 70 year old African female from [place] referred at the request of Dr. Delrae Alfred for evaluation of chest discomfort and burning.  She has had persistent severe pyrosis with regurgitation of gastric contents and episodes of vomiting despite taking omeprazole.  She is on no gastric irritants.  She denies dysphagia. History is provided by her daughter, who is translating.  The patient also complains of some upper abdominal postprandial abdominal pain.  She has had intermittent limited rectal bleeding.  Colonoscopy in 2003 for rectal bleeding demonstrated hemorrhoids.  She has a h/o hepatitis C.   GI Review of Systems    Reports acid reflux, heartburn, and  vomiting.      Denies abdominal pain, belching, bloating, chest pain, dysphagia with liquids, dysphagia with solids, loss of appetite, nausea, vomiting blood, weight loss, and  weight gain.        Denies anal fissure, black tarry stools, change in bowel habit, constipation, diarrhea, diverticulosis, fecal incontinence, heme positive stool, hemorrhoids, irritable bowel syndrome, jaundice, light color stool, liver problems, rectal bleeding, and  rectal pain.    Current Medications (verified): 1)  Cvs Omeprazole 20 Mg  Tbec (Omeprazole) .... Take 1 Tablet By Mouth Every 12 Hours 2)  Claritin 10 Mg  Tabs (Loratadine) .... Take 1 Tablet By Mouth Once A Day As Needed Allergy Symptoms 3)  Tramadol Hcl 50 Mg  Tabs (Tramadol Hcl) .... Take 1-2 Tablets By Mouth Every 8 Hours As Needed For Pain 4)  Aspirin 81 Mg  Tbec (Aspirin) .... One By Mouth Every Day 5)  Oscal 500/200 D-3 500-200 Mg-Unit  Tabs (Calcium-Vitamin D) .... Take One Tablet Three Times A Day 6)  Flexeril 5 Mg  Tabs  (Cyclobenzaprine Hcl) .Marland Kitchen.. 1 Tab By Mouth Q Hs As Needed Back Pain 7)  Enablex 7.5 Mg Xr24h-Tab (Darifenacin Hydrobromide) .... 2 Tabs By Mouth At Bedtime 8)  Miralax  Powd (Polyethylene Glycol 3350) .Marland KitchenMarland KitchenMarland Kitchen 17 G in Fluid By Mouth Daily 9)  Quad Cane  Misc (Misc. Devices) .... 719.46 and  781.2 10)  Pepcid 20 Mg Tabs (Famotidine) .... Take 2 Tabs By Mouth At Bedtime 11)  Tylenol 325 Mg Tabs (Acetaminophen) .... As Needed  Allergies (verified): No Known Drug Allergies  Past History:  Past Medical History: Reviewed history from 02/11/2008 and no changes required. Current Problems:  HEMOCCULT POSITIVE STOOL (ICD-578.1) OSTEOPOROSIS (ICD-733.00) OTHER CHRONIC OTITIS EXTERNA (ICD-380.23) HEPATITIS C, HX OF (ICD-V12.09)...Dr.Edwards(GI) GERD (ICD-530.81) HEMIANOPIA (ICD-368.46) KNEE PAIN, BILATERAL (ICD-719.46) IRRITABLE BOWEL SYNDROME (ICD-564.1)....evaluated by Dr.Edwards(GI)  Past Surgical History: Unremarkable  Family History: No FH of Colon Cancer:  Social History: Retired Lives with daughter. Originally from [place] Never Smoked Alcohol use-no Drug use-no  Review of Systems  The patient denies allergy/sinus, anemia, anxiety-new, arthritis/joint pain, back pain, blood in urine, breast changes/lumps, change in vision, confusion, cough, coughing up blood, depression-new, fainting, fatigue, fever, headaches-new, hearing problems, heart murmur, heart rhythm changes, itching, menstrual pain, muscle pains/cramps, night sweats, nosebleeds, pregnancy symptoms, shortness of breath, skin rash, sleeping problems, sore throat, swelling of feet/legs, swollen lymph glands, thirst - excessive , urination - excessive , urination changes/pain, urine leakage, vision changes, and voice change.    Vital Signs:  Patient profile:   70 year old female Menstrual status:  postmenopausal Height:      61.50 inches Weight:      194 pounds BMI:     36.19 BSA:     1.88 Pulse rate:   92 / minute Pulse  rhythm:   regular BP sitting:   132 / 80  (left arm) Cuff size:   large  Vitals Entered By: Ok Anis CMA (October 27, 2009 4:00 PM)  Physical Exam  Additional Exam:  On physical exam she is a healthy-appearing female  skin: anicteric HEENT: normocephalic; PEERLA; no nasal or pharyngeal abnormalities neck: supple nodes: no cervical lymphadenopathy chest: clear to ausculatation and percussion heart: no murmurs, gallops, or rubs abd: soft, nontender; BS normoactive; no abdominal masses, tenderness, organomegaly rectal: deferred ext: no cynanosis, clubbing, edema skeletal: no deformities neuro: oriented x 3; no focal abnormalities    Impression & Recommendations:  Problem # 1:  GERD (ICD-530.81) Assessment Unchanged  Plan trial of DEXILANT 60 mg daily and upper endoscopy for further evaluation  Orders: EGD (EGD)  Problem # 2:  HEMOCCULT POSITIVE STOOL (ICD-578.1)  Hemoccult-Positive stools and history of intermittent rectal bleeding could be due to hemorrhoids.  Last colonoscopy was in 2003.  Recommendations #1 colonoscopy   Orders: Colonoscopy (Colon)  Problem # 3:  HEPATITIS C, HX OF (ICD-V12.09) I do not think the patient is a good candidate for therapy in view of her age and language barrier.  Patient Instructions: 1)  Copy sent to : Julieanne Manson, MD 2)  Your Upper endocopy is scheduled for 11/01/2009 at 3:30pm 3)  Your colonoscopy is scheduled for 12/10/2009 at 1:30pm 4)  You can pick up your MoviPrep from your pahrmacy today 5)  We are giving you Dexilant samples today 6)  The medication list was reviewed and reconciled.  All changed / newly prescribed medications were explained.  A complete medication list was provided to the patient / caregiver. Prescriptions: MOVIPREP 100 GM  SOLR (PEG-KCL-NACL-NASULF-NA ASC-C) As per prep instructions.  #1 x 0   Entered by:   Merri Ray CMA (AAMA)   Authorized by:   Louis Meckel MD   Signed by:   Merri Ray CMA (AAMA) on 10/27/2009   Method used:   [facility]        [facility] [address] [phone]* (retail)       [address]       [place], [place]  [address]       Ph: [phone] or [phone]       Fax: [phone]   RxID:   [id number]

## 2010-05-03 NOTE — Letter (Signed)
Summary: *Referral Letter  [facility]  [address] [place], [place] [address]   Phone: [phone]  Fax: [phone]    08/28/2009  Thank you in advance for agreeing to see my patient:  [facility] [address] [place], [place]  [address]  Phone: [phone]  Reason for Referral: Ms. Melkonian has been taking Carafate and Omeprazole regularly and following GERD precautions and though she is improved somewhat, continues to have symptoms.  Procedures Requested: Question if needs endoscopic evaluation  Current Medical Problems: 1)  SHOULDER PAIN, LEFT (ICD-719.41) 2)  UNSTEADY GAIT (ICD-781.2) 3)  CONSTIPATION (ICD-564.00) 4)  URINARY URGENCY (ICD-788.63) 5)  POLYURIA (ICD-788.42) 6)  SACROILIAC STRAIN, ACUTE (ICD-846.9) 7)  ANKLE SPRAIN, LEFT (ICD-845.00) 8)  OTITIS EXTERNA, ACUTE, RIGHT (ICD-380.12) 9)  GANGLION CYST, WRIST, RIGHT (ICD-727.41) 10)  SHOULDER PAIN, RIGHT (ICD-719.41) 11)  PALPITATIONS (ICD-785.1) 12)  HEMOCCULT POSITIVE STOOL (ICD-578.1) 13)  OSTEOPOROSIS (ICD-733.00) 14)  SCREENING FOR MALIGNANT NEOPLASM, CERVIX (ICD-V76.2) 15)  OTHER CHRONIC OTITIS EXTERNA (ICD-380.23) 16)  HEALTH MAINTENANCE EXAM (ICD-V70.0) 17)  COSTOCHONDRITIS (ICD-733.6) 18)  VAGINITIS, CANDIDAL (ICD-112.1) 19)  HEPATITIS C, HX OF (ICD-V12.09) 20)  GERD (ICD-530.81) 21)  HEMIANOPIA (ICD-368.46) 22)  KNEE PAIN, BILATERAL (ICD-719.46) 23)  IRRITABLE BOWEL SYNDROME (ICD-564.1)   Current Medications: 1)  SUCRALFATE 1 GM  TABS (SUCRALFATE) Take one tablet by mouth three times a day 2)  [facility] OMEPRAZOLE 20 MG  TBEC (OMEPRAZOLE) Take 1 tablet by mouth every 12 hours 3)  CLARITIN 10 MG  TABS (LORATADINE) Take 1 tablet by mouth once a day as needed allergy symptoms 4)  NYSTATIN 100000 UNIT/GM  CREA (NYSTATIN) 1 application to affected area two times a day 5)  TRAMADOL HCL 50 MG  TABS (TRAMADOL HCL) Take 1-2 tablets by mouth every 8 hours as needed for  pain 6)  CORTISPORIN 3.5-10000-1  SUSP (NEOMYCIN-POLYMYXIN-HC) Place 3 drops in affected ear every 6 hours for 10 days 7)  ASPIRIN 81 MG  TBEC (ASPIRIN) one by mouth every day 8)  OSCAL 500/200 D-3 500-200 MG-UNIT  TABS (CALCIUM-VITAMIN D) Take one tablet three times a day 9)  FOSAMAX 70 MG  TABS (ALENDRONATE SODIUM) Take one tablet each week for bone-building 10)  FLEXERIL 5 MG  TABS (CYCLOBENZAPRINE HCL) 1 tab by mouth q hs as needed back pain 11)  VOLTAREN 1 % GEL (DICLOFENAC SODIUM) apply 4gm to affected area up to four times per day for pain **Pharmacy - discontinue lidoderm patch** 12)  ENABLEX 7.5 MG XR24H-TAB (DARIFENACIN HYDROBROMIDE) 2 tabs by mouth at bedtime 13)  MIRALAX  POWD (POLYETHYLENE GLYCOL 3350) 17 g in fluid by mouth daily 14)  QUAD CANE  MISC (MISC. DEVICES) 719.46 and  781.2   Past Medical History: 1)  Current Problems:  2)  HEMOCCULT POSITIVE STOOL (ICD-578.1) 3)  OSTEOPOROSIS (ICD-733.00) 4)  OTHER CHRONIC OTITIS EXTERNA (ICD-380.23) 5)  HEPATITIS C, HX OF (ICD-V12.09)...Dr.Edwards(GI) 6)  GERD (ICD-530.81) 7)  HEMIANOPIA (ICD-368.46) 8)  KNEE PAIN, BILATERAL (ICD-719.46) 9)  IRRITABLE BOWEL SYNDROME (ICD-564.1)....evaluated by Dr.Edwards(GI)      Thank you again for agreeing to see our patient; please contact us if you have any further questions or need additional information.  Sincerely,  Julieanne Manson MD

## 2010-05-03 NOTE — Progress Notes (Signed)
Summary: GI referral   Phone Note Outgoing Call   Summary of Call: Debra:  GI referral order and letter. Initial call taken by: Julieanne Manson MD,  Aug 28, 2009 12:57 PM

## 2010-05-03 NOTE — Letter (Signed)
Summary: Results Letter  [facility]  [address] [place], [place] [address]   Phone: [phone]  Fax: [phone]        November 04, 2009 MRN: [id number]    Karen Wilson [address] [place], [place]  [address]    Dear [name],   Your biopsies demonstrated inflammatory changes only.    Please follow the recommendations previously discussed.  Should you have any immediate concerns or questions, feel free to contact me at the office.    Sincerely,  Barbette Hair. Arlyce Dice, M.D., [facility]          Sincerely,  Louis Meckel MD  This letter has been electronically signed by your physician.  Appended Document: Results Letter letter mailed

## 2010-05-03 NOTE — Miscellaneous (Signed)
Summary: RRENEWAL SUMMARY FOR PHYSICAL   RRENEWAL SUMMARY FOR PHYSICAL   Imported By: Arta Bruce 02/11/2010 10:39:32  _____________________________________________________________________  External Attachment:    Type:   Image     Comment:   External Document

## 2010-05-03 NOTE — Assessment & Plan Note (Signed)
Summary: f.u after procedures....em    History of Present Illness Visit Type: Follow-up Visit Primary GI MD: Melvia Heaps MD [facility] Primary Provider: Julieanne Manson, MD Requesting Provider: n/a Chief Complaint: Patient is here for f/u after endoscopy and colonoscopy. Patient denies any problems at this time (daughter interpretting). History of Present Illness:   Ms. Tyer has returned for followup following her upper endoscopy and colonoscopy. The  former demonstrated esophagitis in the distal esophagus.  The latter was normal.  At this time she is feeling well and has no GI complaints.  She remains on DEXILANT.   GI Review of Systems    Reports bloating.      Denies abdominal pain, acid reflux, belching, chest pain, dysphagia with liquids, dysphagia with solids, heartburn, loss of appetite, nausea, vomiting, vomiting blood, weight loss, and  weight gain.      Reports constipation.     Denies anal fissure, black tarry stools, change in bowel habit, diarrhea, diverticulosis, fecal incontinence, heme positive stool, hemorrhoids, irritable bowel syndrome, jaundice, light color stool, liver problems, rectal bleeding, and  rectal pain.    Current Medications (verified): 1)  Claritin 10 Mg  Tabs (Loratadine) .... Take 1 Tablet By Mouth Once A Day As Needed Allergy Symptoms 2)  Tramadol Hcl 50 Mg  Tabs (Tramadol Hcl) .... Take 1-2 Tablets By Mouth Every 8 Hours As Needed For Pain 3)  Aspirin 81 Mg  Tbec (Aspirin) .... One By Mouth Every Day 4)  Oscal 500/200 D-3 500-200 Mg-Unit  Tabs (Calcium-Vitamin D) .... Take One Tablet Three Times A Day 5)  Flexeril 5 Mg  Tabs (Cyclobenzaprine Hcl) .Marland Kitchen.. 1 Tab By Mouth Q Hs As Needed Back Pain 6)  Enablex 7.5 Mg Xr24h-Tab (Darifenacin Hydrobromide) .... 2 Tabs By Mouth At Bedtime 7)  Miralax  Powd (Polyethylene Glycol 3350) .Marland KitchenMarland KitchenMarland Kitchen 17 G in Fluid By Mouth Daily 8)  [employer] (Misc. Devices) .... 719.46 and  781.2 9)  Tylenol 325 Mg Tabs  (Acetaminophen) .... As Needed 10)  Dexilant 60 Mg Cpdr (Dexlansoprazole) .... Take 1 Tab 1/2 Before Breakfast  Allergies (verified): No Known Drug Allergies  Past History:  Past Medical History: Reviewed history from 02/11/2008 and no changes required. Current Problems:  HEMOCCULT POSITIVE STOOL (ICD-578.1) OSTEOPOROSIS (ICD-733.00) OTHER CHRONIC OTITIS EXTERNA (ICD-380.23) HEPATITIS C, HX OF (ICD-V12.09)...Dr.Edwards(GI) GERD (ICD-530.81) HEMIANOPIA (ICD-368.46) KNEE PAIN, BILATERAL (ICD-719.46) IRRITABLE BOWEL SYNDROME (ICD-564.1)....evaluated by Dr.Edwards(GI)  Past Surgical History: Reviewed history from 10/27/2009 and no changes required. Unremarkable  Social History: Reviewed history from 10/27/2009 and no changes required. Retired Lives with daughter. Originally from [place] Never Smoked Alcohol use-no Drug use-no  Review of Systems  The patient denies allergy/sinus, anemia, anxiety-new, arthritis/joint pain, back pain, blood in urine, breast changes/lumps, change in vision, confusion, cough, coughing up blood, depression-new, fainting, fatigue, fever, headaches-new, hearing problems, heart murmur, heart rhythm changes, itching, menstrual pain, muscle pains/cramps, night sweats, nosebleeds, pregnancy symptoms, shortness of breath, skin rash, sleeping problems, sore throat, swelling of feet/legs, swollen lymph glands, thirst - excessive , urination - excessive , urination changes/pain, urine leakage, vision changes, and voice change.    Vital Signs:  Patient profile:   70 year old female Menstrual status:  postmenopausal Height:      61.5 inches Weight:      195.25 pounds BMI:     36.43 BSA:     1.88 Pulse rate:   88 / minute Pulse rhythm:   regular BP sitting:   126 / 72  (  left arm) Cuff size:   large  Vitals Entered By: Lamona Curl CMA Duncan Dull) (January 11, 2010 3:01 PM)   Impression & Recommendations:  Problem # 1:  GERD (ICD-530.81) Assessment  Improved I will try switching her to pantoprazole in an effort to save  some cost for her medications  Patient Instructions: 1)  Copy sent to : Julieanne Manson, MD 2)  We are sending you in a new prescription of Protonix 3)  The medication list was reviewed and reconciled.  All changed / newly prescribed medications were explained.  A complete medication list was provided to the patient / caregiver. Prescriptions: PROTONIX 40 MG SOLR (PANTOPRAZOLE SODIUM) take one tab before breakfast daily  #30 x 10   Entered and Authorized by:   Louis Meckel MD   Signed by:   Louis Meckel MD on 01/11/2010   Method used:   [facility]        [facility] [address] [address] [phone]* (retail)       [address]       [place], [place]  [address]       Ph: [phone] or [phone]       Fax: [phone]   RxID:   [id number]

## 2010-05-05 NOTE — Progress Notes (Signed)
Summary: CHEST PAINS  Phone Note Call from Patient Call back at [phone]   Caller: DAUGHTER-Karen Wilson Reason for Call: Talk to Nurse Summary of Call: [name] DAUGHTER, Karen Wilson CALLED FOR HER AND SAYS THAT SHE HAS BEEN HAVING SOME PAIN IN HER CHEST FOR A COUPLE OF DAYS AND THE PAIN HAS BEEN GOING AND COMING AND WANTED TO KNOW IF SHE COULD BRING HER MOTHER IN TODAY. Initial call taken by: Leodis Rains,  March 22, 2010 9:38 AM  Follow-up for Phone Call        Spoke with daughter, who isn't with mother at present.  States CP started 2-3 days ago, going through from front to back.  Feels heart is beating irregularly.  If she takes pain pills, it goes away then comes back.  A couple of times last week, they changed her "stomach medicine" back and forth -- daughter is going to go to mother's and get more information from her, call us back. Follow-up by: Dutch Quint RN,  March 22, 2010 9:46 AM  Additional Follow-up for Phone Call Additional follow up Details #1::        Chest feels heavier when she's lying, CP is worse when lying down, most of the time goes away when she sits up.  Occasionally occurs when she is up, but is not as bad as when she lies down.  Yesterday, pain went to her neck and she had a headache.  Denies SOB, nausea, vomiting, fever.  States very little cough.  Put herself back on omeprazole a couple of weeks ago because Dr. Arlyce Dice wanted to stop her on the Dexilant and return her to Protonix, but Medicaid won't pay for it.  Two months ago fell at home, fell on chair handle.  CP didn't start until long after.  Daughter wonders if fall contributing to chest soreness, possibly aggravating with housework.  Advised re home management for muscle pain with moist heat and pain meds per medication list.  Also advised re call back recommendations per chest pain protocol, instructed re signs and symptoms of MI.  Pt. daughter interpreted for pt. -- verbalized  understanding.  Dutch Quint RN  March 22, 2010 11:01 AM     Additional Follow-up for Phone Call Additional follow up Details #2::    she needs to be seen --please call and have them go to ED.  May just be GI, but if unable to get in and having associated palpitations, would prefer having her seen.  Spoke with Aggie Cosier regarding this--apparently this was recommended yesterday, but pt. refused Aggie Cosier to document this conversation as well. Aggie Cosier to call and give my recommendation to be seen as well Julieanne Manson MD  March 23, 2010 2:34 PM   Spoke yesterday with pt.'s daughter as interpreter -- pt. did not want to go to the ED.  Advised that symptoms could be related to cardiac issues, daughter stated that she didn't want to go to the ED if it wasn't necessary.  Again was advised that being evaluated by the ED was indicated since we had no openings available at that time and pt.'s symptoms needed to be further evaluated.  Today, I spoke with daughter and reiterated what I said yesterday, as well as Dr. Renne Crigler recommendation.  Daughter stated that her mother is better today.  After again advising that she be evaluated in the ED, daughter said she will go to her mother's after work and "look into it."  Dutch Quint RN  March 23, 2010 2:45 PM

## 2010-05-05 NOTE — Miscellaneous (Signed)
Summary: Rehab Report//DISCHARGE SUMMARY  Rehab Report//DISCHARGE SUMMARY   Imported By: Arta Bruce 03/17/2010 12:39:05  _____________________________________________________________________  External Attachment:    Type:   Image     Comment:   External Document

## 2010-05-05 NOTE — Assessment & Plan Note (Signed)
Summary: 4 month elbow/gait /tmm   Vital Signs:  Patient profile:   70 year old female Menstrual status:  postmenopausal Weight:      200.56 pounds BMI:     37.42 Temp:     97 degrees F oral Pulse rate:   90 / minute Pulse rhythm:   regular Resp:     20 per minute BP sitting:   128 / 90  (left arm)  Vitals Entered By: Levon Hedger (April 05, 2010 4:28 PM) CC: f/u from 12/02/09 for left elbow pain and gait. Completed PT and is has helped. Elbow feeling much better. Complaining of chest pain. Larey Seat and hit her chest 2 months ago. Believes it maybe due to that. She will have pain any time she does any physical activity. Has question in re: to her Acid Reflux med.  Is Patient Diabetic? No Pain Assessment Patient in pain? yes     Location: chest Intensity: 6 Type: sore Onset of pain  Constant  Does patient need assistance? Functional Status Self care Ambulation Normal   Primary Care Provider:  Julieanne Manson, MD  CC:  f/u from 12/02/09 for left elbow pain and gait. Completed PT and is has helped. Elbow feeling much better. Complaining of chest pain. Larey Seat and hit her chest 2 months ago. Believes it maybe due to that. She will have pain any time she does any physical activity. Has question in re: to her Acid Reflux med. Marland Kitchen  History of Present Illness: 1.  Left elbow and gait unsteadiness:  Went to PT and doing better.  2.  Larey Seat in home "like a tree"   and hit her sternum against a chair arm--wooden.  Occurred maybe around September.  Pain still continues.  Sounds like she improved at one point, but overdid over the holidays and  pain recurred.  3.  GERD:  Dr. Arlyce Dice  switched pt. to Protonix for financial reasons, but was not covered.  Daughter has some Omeprazole at home and restarted that.  Pt., however, feels Dexilant worked better.  Appears to have been some confusion with her meds beginning of November as switch back to Dexilant was okayed by Dr. Arlyce Dice and then later denied  when pharmacy sent in refill.    Current Medications (verified): 1)  Claritin 10 Mg  Tabs (Loratadine) .... Take 1 Tablet By Mouth Once A Day As Needed Allergy Symptoms 2)  Tramadol Hcl 50 Mg  Tabs (Tramadol Hcl) .... Take 1-2 Tablets By Mouth Every 8 Hours As Needed For Pain 3)  Aspirin 81 Mg  Tbec (Aspirin) .... One By Mouth Every Day 4)  Oscal 500/200 D-3 500-200 Mg-Unit  Tabs (Calcium-Vitamin D) .... Take One Tablet Three Times A Day 5)  Flexeril 5 Mg  Tabs (Cyclobenzaprine Hcl) .Marland Kitchen.. 1 Tab By Mouth Q Hs As Needed Back Pain 6)  Enablex 7.5 Mg Xr24h-Tab (Darifenacin Hydrobromide) .... 2 Tabs By Mouth At Bedtime 7)  Miralax  Powd (Polyethylene Glycol 3350) .Marland KitchenMarland KitchenMarland Kitchen 17 G in Fluid By Mouth Daily 8)  [employer] (Misc. Devices) .... 719.46 and  781.2 9)  Tylenol 325 Mg Tabs (Acetaminophen) .... As Needed 10)  Protonix 40 Mg Solr (Pantoprazole Sodium) .... Take One Tab Before Breakfast Daily  Allergies (verified): No Known Drug Allergies  Physical Exam  Chest Wall:  Very tender over upper and mid sternum.  No contusion or crepitation. Tender over mid thoracic paraspinous musculature bilaterally. Lungs:  Normal respiratory effort, chest expands symmetrically. Lungs are  clear to auscultation, no crackles or wheezes. Heart:  Normal rate and regular rhythm. S1 and S2 normal without gallop, murmur, click, rub or other extra sounds.   Impression & Recommendations:  Problem # 1:  BACK PAIN, UPPER (ICD-724.5)  Her updated medication list for this problem includes:    Tramadol Hcl 50 Mg Tabs (Tramadol hcl) .Marland Kitchen... Take 1-2 tablets by mouth every 8 hours as needed for pain    Aspirin 81 Mg Tbec (Aspirin) ..... One by mouth every day    Flexeril 5 Mg Tabs (Cyclobenzaprine hcl) .Marland Kitchen... 1 tab by mouth q hs as needed back pain    Tylenol 325 Mg Tabs (Acetaminophen) .Marland Kitchen... As needed  Orders: Physical Therapy Referral (PT)  Problem # 2:  CHEST WALL PAIN, ACUTE (ICD-786.52)  Her updated medication  list for this problem includes:    Aspirin 81 Mg Tbec (Aspirin) ..... One by mouth every day  Orders: Physical Therapy Referral (PT)  Problem # 3:  ELBOW PAIN, LEFT (ICD-719.42) Improved Encouraged to continue home exercise program.  Problem # 4:  UNSTEADY GAIT (ICD-781.2) Encouraged to use cane even in home to prevent injuries similar to chest and back pain above  Problem # 5:  GERD (ICD-530.81) Change back to Dexilant Her updated medication list for this problem includes:    Dexilant 60 Mg Cpdr (Dexlansoprazole) .Marland Kitchen... 1 cap on empty stomach 1/2 hour before 1st meal of day.  Problem # 6:  Preventive Health Care (ICD-V70.0) flu vaccine  Complete Medication List: 1)  Claritin 10 Mg Tabs (Loratadine) .... Take 1 tablet by mouth once a day as needed allergy symptoms 2)  Tramadol Hcl 50 Mg Tabs (Tramadol hcl) .... Take 1-2 tablets by mouth every 8 hours as needed for pain 3)  Aspirin 81 Mg Tbec (Aspirin) .... One by mouth every day 4)  Oscal 500/200 D-3 500-200 Mg-unit Tabs (Calcium-vitamin d) .... Take one tablet three times a day 5)  Flexeril 5 Mg Tabs (Cyclobenzaprine hcl) .Marland Kitchen.. 1 tab by mouth q hs as needed back pain 6)  Enablex 7.5 Mg Xr24h-tab (Darifenacin hydrobromide) .... 2 tabs by mouth at bedtime 7)  Miralax Powd (Polyethylene glycol 3350) .Marland KitchenMarland KitchenMarland Kitchen 17 g in fluid by mouth daily 8)  [employer] (Misc. devices) .... 719.46 and  781.2 9)  Tylenol 325 Mg Tabs (Acetaminophen) .... As needed 10)  Dexilant 60 Mg Cpdr (Dexlansoprazole) .Marland Kitchen.. 1 cap on empty stomach 1/2 hour before 1st meal of day.  Patient Instructions: 1)  Follow up with Dr. Delrae Alfred in 4 months --CPP--and follow up of PT for chest and back Prescriptions: DEXILANT 60 MG CPDR (DEXLANSOPRAZOLE) 1 cap on empty stomach 1/2 hour before 1st meal of day.  #30 x 11   Entered and Authorized by:   Julieanne Manson MD   Signed by:   Julieanne Manson MD on 04/05/2010   Method used:   [facility]        [facility] [address] [phone]* (retail)       [address]       [place], [place]  [address]       Ph: [phone] or [phone]       Fax: [phone]   RxID:   [id number]    Orders Added: 1)  Physical Therapy Referral [PT] 2)  Est. Patient Level III [[address]]   Not Administered:    Influenza Vaccine not given due to: declined

## 2010-05-05 NOTE — Medication Information (Signed)
Summary: RX Folder//[facility]  RX Folder//[facility]   Imported By: Arta Bruce 03/14/2010 10:06:04  _____________________________________________________________________  External Attachment:    Type:   Image     Comment:   External Document

## 2010-05-05 NOTE — Progress Notes (Signed)
Summary: med change needed  Phone Note From Pharmacy   [facility] [address] [phone]* Summary of Call: Pt.'s insurance will not cover Dexilant, other options are generic prevacid or prilosec, can you change? Initial call taken by: Gaylyn Cheers RN,  April 07, 2010 5:01 PM  Follow-up for Phone Call        Doine--let pt. know Follow-up by: Julieanne Manson MD,  April 11, 2010 8:35 AM  Additional Follow-up for Phone Call Additional follow up Details #1::        Left message on answer machine for pt. to return call. Gaylyn Cheers RN  April 11, 2010 8:43 AM    Daughter returned call info given. Dutch Quint RN  April 11, 2010 10:24 AM     New/Updated Medications: PREVACID 30 MG CPDR (LANSOPRAZOLE) 1 cap by mouth on empty stomach 1/2 hour before first meal of day Prescriptions: PREVACID 30 MG CPDR (LANSOPRAZOLE) 1 cap by mouth on empty stomach 1/2 hour before first meal of day  #30 x 11   Entered and Authorized by:   Julieanne Manson MD   Signed by:   Julieanne Manson MD on 04/11/2010   Method used:   [facility]ronically to        [facility] [address] [phone]* (retail)       [address]       [place], [place]  [address]       Ph: [phone] or [phone]       Fax: [phone]   RxID:   [id number]

## 2010-05-06 ENCOUNTER — Encounter (INDEPENDENT_AMBULATORY_CARE_PROVIDER_SITE_OTHER): Payer: Self-pay | Admitting: Internal Medicine

## 2010-05-10 ENCOUNTER — Ambulatory Visit: Payer: Medicare Other | Attending: Internal Medicine | Admitting: Physical Therapy

## 2010-05-10 DIAGNOSIS — M546 Pain in thoracic spine: Secondary | ICD-10-CM | POA: Insufficient documentation

## 2010-05-10 DIAGNOSIS — IMO0001 Reserved for inherently not codable concepts without codable children: Secondary | ICD-10-CM | POA: Insufficient documentation

## 2010-05-10 DIAGNOSIS — M2569 Stiffness of other specified joint, not elsewhere classified: Secondary | ICD-10-CM | POA: Insufficient documentation

## 2010-05-11 NOTE — Miscellaneous (Signed)
Summary: Rehab Report//INITIAL SUMMARY  Rehab Report//INITIAL SUMMARY   Imported By: Arta Bruce 05/06/2010 10:59:07  _____________________________________________________________________  External Attachment:    Type:   Image     Comment:   External Document

## 2010-05-13 ENCOUNTER — Encounter (INDEPENDENT_AMBULATORY_CARE_PROVIDER_SITE_OTHER): Payer: Self-pay | Admitting: Internal Medicine

## 2010-05-13 ENCOUNTER — Ambulatory Visit: Payer: Medicare Other | Admitting: Physical Therapy

## 2010-05-31 NOTE — Miscellaneous (Signed)
Summary: Rehab Report//DISCHARGE SUMMARY  Rehab Report//DISCHARGE SUMMARY   Imported By: Arta Bruce 05/24/2010 15:20:45  _____________________________________________________________________  External Attachment:    Type:   Image     Comment:   External Document

## 2010-08-02 ENCOUNTER — Other Ambulatory Visit (HOSPITAL_COMMUNITY)
Admission: RE | Admit: 2010-08-02 | Discharge: 2010-08-02 | Disposition: A | Discharge status: Home / Self Care | Payer: Medicare Other | Source: Ambulatory Visit | Attending: Internal Medicine | Admitting: Internal Medicine

## 2010-08-02 ENCOUNTER — Other Ambulatory Visit: Payer: Self-pay | Admitting: Internal Medicine

## 2010-08-02 DIAGNOSIS — Z124 Encounter for screening for malignant neoplasm of cervix: Secondary | ICD-10-CM | POA: Insufficient documentation

## 2010-08-19 NOTE — Procedures (Signed)
[facility]  Patient:    Karen Wilson, Karen Wilson Visit Number: [id number] MRN: [id number]          Service Type: END Location: ENDO Attending Physician:  Orland Mustard Dictated by:   Llana Aliment. Randa Evens, M.D. Proc. Date: 07/17/01 Admit Date:  07/17/2001   CC:         Barton Fanny, [facility]   Procedure Report  PROCEDURE:  Colonoscopy.  MEDICATIONS:  Fentanyl 100 mcg, Versed 10 mg IV.  INDICATION:  Rectal bleeding.  SCOPE:  Olympus pediatric video colonoscope.  DESCRIPTION OF PROCEDURE:  The procedure had been explained to the patient and consent obtained.  With the patient in the left lateral decubitus position, the Olympus pediatric video colonoscope inserted and advanced under direct visualization.  The prep was quite good.  Using abdominal pressure and position changes, we were able to reach the cecum.  The ileocecal valve was clearly seen, as was the appendiceal orifice.  Scope withdrawn.  Cecum, ascending colon, hepatic flexure, transverse colon, splenic flexure, descending, and sigmoid colon were seen well upon removal.  No polyps or other lesions seen.  The scope was withdrawn down into the splenic flexure and descending colon.  The patient has had chronic left upper quadrant quadrant pain, and absolutely nothing could be seen abnormal in this area.  The sigmoid colon was seen well and was normal.  Internal hemorrhoids were seen in the rectum and were normal.  The scope was retroflexed with a finding of internal hemorrhoids.  No other abnormality was seen.  She tolerated the procedure well, was maintained on low-flow oxygen and pulse oximeter throughout the procedure.  ASSESSMENT:  Rectal bleeding probably due to internal hemorrhoids.  PLAN:  Will give hemorrhoid sheet.  Will see back in the office on an as-needed basis. Dictated by:   Llana Aliment. Randa Evens, M.D. Attending Physician:  Orland Mustard DD:  07/17/01 TD:   07/17/01 Job: [id number]

## 2011-01-12 ENCOUNTER — Ambulatory Visit (HOSPITAL_COMMUNITY)
Admission: RE | Admit: 2011-01-12 | Discharge: 2011-01-12 | Disposition: A | Discharge status: Home / Self Care | Payer: Medicare Other | Source: Ambulatory Visit | Attending: Internal Medicine | Admitting: Internal Medicine

## 2011-01-12 ENCOUNTER — Other Ambulatory Visit: Payer: Self-pay | Admitting: Internal Medicine

## 2011-01-12 DIAGNOSIS — R109 Unspecified abdominal pain: Secondary | ICD-10-CM | POA: Insufficient documentation

## 2011-06-23 DIAGNOSIS — N3090 Cystitis, unspecified without hematuria: Secondary | ICD-10-CM | POA: Diagnosis not present

## 2011-06-23 DIAGNOSIS — M25519 Pain in unspecified shoulder: Secondary | ICD-10-CM | POA: Diagnosis not present

## 2011-06-27 ENCOUNTER — Other Ambulatory Visit (HOSPITAL_COMMUNITY): Payer: Self-pay | Admitting: Internal Medicine

## 2011-06-27 DIAGNOSIS — M542 Cervicalgia: Secondary | ICD-10-CM

## 2011-06-28 ENCOUNTER — Other Ambulatory Visit (HOSPITAL_COMMUNITY): Payer: Self-pay | Admitting: Internal Medicine

## 2011-06-28 DIAGNOSIS — R52 Pain, unspecified: Secondary | ICD-10-CM

## 2011-06-28 DIAGNOSIS — R609 Edema, unspecified: Secondary | ICD-10-CM

## 2011-06-30 ENCOUNTER — Other Ambulatory Visit (HOSPITAL_COMMUNITY): Payer: Self-pay | Admitting: Internal Medicine

## 2011-06-30 ENCOUNTER — Ambulatory Visit (HOSPITAL_COMMUNITY)
Admission: RE | Admit: 2011-06-30 | Discharge: 2011-06-30 | Disposition: A | Discharge status: Home / Self Care | Payer: Medicare Other | Source: Ambulatory Visit | Attending: Internal Medicine | Admitting: Internal Medicine

## 2011-06-30 DIAGNOSIS — I517 Cardiomegaly: Secondary | ICD-10-CM | POA: Insufficient documentation

## 2011-06-30 DIAGNOSIS — R52 Pain, unspecified: Secondary | ICD-10-CM

## 2011-06-30 DIAGNOSIS — R222 Localized swelling, mass and lump, trunk: Secondary | ICD-10-CM | POA: Diagnosis not present

## 2011-06-30 DIAGNOSIS — R609 Edema, unspecified: Secondary | ICD-10-CM

## 2011-06-30 DIAGNOSIS — M5030 Other cervical disc degeneration, unspecified cervical region: Secondary | ICD-10-CM | POA: Insufficient documentation

## 2011-06-30 DIAGNOSIS — R220 Localized swelling, mass and lump, head: Secondary | ICD-10-CM | POA: Insufficient documentation

## 2011-06-30 DIAGNOSIS — R221 Localized swelling, mass and lump, neck: Secondary | ICD-10-CM | POA: Insufficient documentation

## 2011-06-30 MED ORDER — IOHEXOL 300 MG/ML  SOLN
100.0000 mL | Freq: Once | INTRAMUSCULAR | Status: AC | PRN
  Administered 2011-06-30: 100 mL via INTRAVENOUS

## 2011-07-12 DIAGNOSIS — N390 Urinary tract infection, site not specified: Secondary | ICD-10-CM | POA: Diagnosis not present

## 2011-10-18 DIAGNOSIS — R109 Unspecified abdominal pain: Secondary | ICD-10-CM | POA: Diagnosis not present

## 2011-10-18 DIAGNOSIS — R002 Palpitations: Secondary | ICD-10-CM | POA: Diagnosis not present

## 2011-10-27 ENCOUNTER — Encounter (INDEPENDENT_AMBULATORY_CARE_PROVIDER_SITE_OTHER): Payer: Medicare Other

## 2011-10-27 DIAGNOSIS — R002 Palpitations: Secondary | ICD-10-CM | POA: Diagnosis not present

## 2012-01-09 DIAGNOSIS — R109 Unspecified abdominal pain: Secondary | ICD-10-CM | POA: Diagnosis not present

## 2012-01-09 DIAGNOSIS — R030 Elevated blood-pressure reading, without diagnosis of hypertension: Secondary | ICD-10-CM | POA: Diagnosis not present

## 2012-01-09 DIAGNOSIS — K219 Gastro-esophageal reflux disease without esophagitis: Secondary | ICD-10-CM | POA: Diagnosis not present

## 2012-01-18 DIAGNOSIS — Z0000 Encounter for general adult medical examination without abnormal findings: Secondary | ICD-10-CM | POA: Diagnosis not present

## 2012-01-18 DIAGNOSIS — M810 Age-related osteoporosis without current pathological fracture: Secondary | ICD-10-CM | POA: Diagnosis not present

## 2012-01-18 DIAGNOSIS — H60399 Other infective otitis externa, unspecified ear: Secondary | ICD-10-CM | POA: Diagnosis not present

## 2012-01-18 DIAGNOSIS — R109 Unspecified abdominal pain: Secondary | ICD-10-CM | POA: Diagnosis not present

## 2012-03-04 DIAGNOSIS — M810 Age-related osteoporosis without current pathological fracture: Secondary | ICD-10-CM | POA: Diagnosis not present

## 2012-04-16 DIAGNOSIS — I10 Essential (primary) hypertension: Secondary | ICD-10-CM | POA: Diagnosis not present

## 2012-04-16 DIAGNOSIS — R42 Dizziness and giddiness: Secondary | ICD-10-CM | POA: Diagnosis not present

## 2012-05-14 DIAGNOSIS — I10 Essential (primary) hypertension: Secondary | ICD-10-CM | POA: Diagnosis not present

## 2012-05-14 DIAGNOSIS — M542 Cervicalgia: Secondary | ICD-10-CM | POA: Diagnosis not present

## 2012-06-11 DIAGNOSIS — I10 Essential (primary) hypertension: Secondary | ICD-10-CM | POA: Diagnosis not present

## 2012-10-15 ENCOUNTER — Other Ambulatory Visit: Payer: Self-pay | Admitting: Internal Medicine

## 2012-10-15 DIAGNOSIS — R1310 Dysphagia, unspecified: Secondary | ICD-10-CM | POA: Diagnosis not present

## 2012-10-15 DIAGNOSIS — M940 Chondrocostal junction syndrome [Tietze]: Secondary | ICD-10-CM | POA: Diagnosis not present

## 2012-10-15 DIAGNOSIS — K219 Gastro-esophageal reflux disease without esophagitis: Secondary | ICD-10-CM | POA: Diagnosis not present

## 2012-10-25 ENCOUNTER — Ambulatory Visit
Admission: RE | Admit: 2012-10-25 | Discharge: 2012-10-25 | Disposition: A | Discharge status: Home / Self Care | Payer: Medicare Other | Source: Ambulatory Visit | Attending: Internal Medicine | Admitting: Internal Medicine

## 2012-10-25 DIAGNOSIS — R1310 Dysphagia, unspecified: Secondary | ICD-10-CM

## 2012-10-25 DIAGNOSIS — K219 Gastro-esophageal reflux disease without esophagitis: Secondary | ICD-10-CM | POA: Diagnosis not present

## 2012-10-25 DIAGNOSIS — K449 Diaphragmatic hernia without obstruction or gangrene: Secondary | ICD-10-CM | POA: Diagnosis not present

## 2013-03-31 DIAGNOSIS — J209 Acute bronchitis, unspecified: Secondary | ICD-10-CM | POA: Diagnosis not present

## 2013-05-13 ENCOUNTER — Other Ambulatory Visit: Payer: Self-pay | Admitting: Internal Medicine

## 2013-05-13 ENCOUNTER — Ambulatory Visit
Admission: RE | Admit: 2013-05-13 | Discharge: 2013-05-13 | Disposition: A | Discharge status: Home / Self Care | Payer: Medicare Other | Source: Ambulatory Visit | Attending: Internal Medicine | Admitting: Internal Medicine

## 2013-05-13 DIAGNOSIS — N390 Urinary tract infection, site not specified: Secondary | ICD-10-CM | POA: Diagnosis not present

## 2013-05-13 DIAGNOSIS — R109 Unspecified abdominal pain: Secondary | ICD-10-CM | POA: Diagnosis not present

## 2013-09-10 DIAGNOSIS — H60399 Other infective otitis externa, unspecified ear: Secondary | ICD-10-CM | POA: Diagnosis not present

## 2013-09-29 DIAGNOSIS — I10 Essential (primary) hypertension: Secondary | ICD-10-CM | POA: Diagnosis not present

## 2014-01-20 DIAGNOSIS — M25539 Pain in unspecified wrist: Secondary | ICD-10-CM | POA: Diagnosis not present

## 2014-01-20 DIAGNOSIS — R109 Unspecified abdominal pain: Secondary | ICD-10-CM | POA: Diagnosis not present

## 2014-01-20 DIAGNOSIS — R350 Frequency of micturition: Secondary | ICD-10-CM | POA: Diagnosis not present

## 2014-01-20 DIAGNOSIS — K5900 Constipation, unspecified: Secondary | ICD-10-CM | POA: Diagnosis not present

## 2014-01-27 DIAGNOSIS — E871 Hypo-osmolality and hyponatremia: Secondary | ICD-10-CM | POA: Diagnosis not present

## 2014-02-12 DIAGNOSIS — E871 Hypo-osmolality and hyponatremia: Secondary | ICD-10-CM | POA: Diagnosis not present

## 2014-04-10 DIAGNOSIS — Z1389 Encounter for screening for other disorder: Secondary | ICD-10-CM | POA: Diagnosis not present

## 2014-04-10 DIAGNOSIS — E663 Overweight: Secondary | ICD-10-CM | POA: Diagnosis not present

## 2014-04-10 DIAGNOSIS — Z6834 Body mass index (BMI) 34.0-34.9, adult: Secondary | ICD-10-CM | POA: Diagnosis not present

## 2014-04-10 DIAGNOSIS — Z0001 Encounter for general adult medical examination with abnormal findings: Secondary | ICD-10-CM | POA: Diagnosis not present

## 2014-04-10 DIAGNOSIS — H60391 Other infective otitis externa, right ear: Secondary | ICD-10-CM | POA: Diagnosis not present

## 2014-04-10 DIAGNOSIS — Z23 Encounter for immunization: Secondary | ICD-10-CM | POA: Diagnosis not present

## 2014-04-10 DIAGNOSIS — M8580 Other specified disorders of bone density and structure, unspecified site: Secondary | ICD-10-CM | POA: Diagnosis not present

## 2014-04-10 DIAGNOSIS — K589 Irritable bowel syndrome without diarrhea: Secondary | ICD-10-CM | POA: Diagnosis not present

## 2014-04-10 DIAGNOSIS — I10 Essential (primary) hypertension: Secondary | ICD-10-CM | POA: Diagnosis not present

## 2014-04-24 DIAGNOSIS — Z1211 Encounter for screening for malignant neoplasm of colon: Secondary | ICD-10-CM | POA: Diagnosis not present

## 2014-09-24 ENCOUNTER — Other Ambulatory Visit: Payer: Self-pay | Admitting: Internal Medicine

## 2014-09-24 DIAGNOSIS — R1084 Generalized abdominal pain: Secondary | ICD-10-CM | POA: Diagnosis not present

## 2014-09-24 DIAGNOSIS — I10 Essential (primary) hypertension: Secondary | ICD-10-CM | POA: Diagnosis not present

## 2014-09-24 DIAGNOSIS — M8588 Other specified disorders of bone density and structure, other site: Secondary | ICD-10-CM | POA: Diagnosis not present

## 2014-09-24 DIAGNOSIS — K589 Irritable bowel syndrome without diarrhea: Secondary | ICD-10-CM | POA: Diagnosis not present

## 2014-09-25 ENCOUNTER — Ambulatory Visit
Admission: RE | Admit: 2014-09-25 | Discharge: 2014-09-25 | Disposition: A | Discharge status: Home / Self Care | Payer: Medicare Other | Source: Ambulatory Visit | Attending: Internal Medicine | Admitting: Internal Medicine

## 2014-09-25 ENCOUNTER — Other Ambulatory Visit: Payer: Self-pay | Admitting: Internal Medicine

## 2014-09-25 DIAGNOSIS — R1084 Generalized abdominal pain: Secondary | ICD-10-CM

## 2014-09-25 DIAGNOSIS — R1010 Upper abdominal pain, unspecified: Secondary | ICD-10-CM | POA: Diagnosis not present

## 2014-09-25 DIAGNOSIS — R1030 Lower abdominal pain, unspecified: Secondary | ICD-10-CM | POA: Diagnosis not present

## 2014-09-25 MED ORDER — IOPAMIDOL (ISOVUE-300) INJECTION 61%
100.0000 mL | Freq: Once | INTRAVENOUS | Status: AC | PRN
  Administered 2014-09-25: 100 mL via INTRAVENOUS

## 2014-09-28 ENCOUNTER — Other Ambulatory Visit: Payer: Medicare Other

## 2014-10-29 DIAGNOSIS — M859 Disorder of bone density and structure, unspecified: Secondary | ICD-10-CM | POA: Diagnosis not present

## 2014-10-29 DIAGNOSIS — M8589 Other specified disorders of bone density and structure, multiple sites: Secondary | ICD-10-CM | POA: Diagnosis not present

## 2015-01-01 ENCOUNTER — Other Ambulatory Visit: Payer: Self-pay | Admitting: Internal Medicine

## 2015-01-01 ENCOUNTER — Ambulatory Visit
Admission: RE | Admit: 2015-01-01 | Discharge: 2015-01-01 | Disposition: A | Discharge status: Home / Self Care | Payer: Medicare Other | Source: Ambulatory Visit | Attending: Internal Medicine | Admitting: Internal Medicine

## 2015-01-01 DIAGNOSIS — M5440 Lumbago with sciatica, unspecified side: Secondary | ICD-10-CM

## 2015-01-01 DIAGNOSIS — M5432 Sciatica, left side: Secondary | ICD-10-CM | POA: Diagnosis not present

## 2015-01-01 DIAGNOSIS — M47816 Spondylosis without myelopathy or radiculopathy, lumbar region: Secondary | ICD-10-CM | POA: Diagnosis not present

## 2015-01-01 DIAGNOSIS — Z23 Encounter for immunization: Secondary | ICD-10-CM | POA: Diagnosis not present

## 2015-01-01 DIAGNOSIS — M545 Low back pain: Secondary | ICD-10-CM | POA: Diagnosis not present

## 2015-01-01 DIAGNOSIS — R350 Frequency of micturition: Secondary | ICD-10-CM | POA: Diagnosis not present

## 2015-01-15 DIAGNOSIS — H9201 Otalgia, right ear: Secondary | ICD-10-CM | POA: Diagnosis not present

## 2015-01-15 DIAGNOSIS — R42 Dizziness and giddiness: Secondary | ICD-10-CM | POA: Diagnosis not present

## 2015-01-15 DIAGNOSIS — M545 Low back pain: Secondary | ICD-10-CM | POA: Diagnosis not present

## 2015-04-23 DIAGNOSIS — R938 Abnormal findings on diagnostic imaging of other specified body structures: Secondary | ICD-10-CM | POA: Diagnosis not present

## 2015-04-23 DIAGNOSIS — K5900 Constipation, unspecified: Secondary | ICD-10-CM | POA: Diagnosis not present

## 2015-04-23 DIAGNOSIS — I10 Essential (primary) hypertension: Secondary | ICD-10-CM | POA: Diagnosis not present

## 2015-04-23 DIAGNOSIS — M8588 Other specified disorders of bone density and structure, other site: Secondary | ICD-10-CM | POA: Diagnosis not present

## 2015-04-23 DIAGNOSIS — E663 Overweight: Secondary | ICD-10-CM | POA: Diagnosis not present

## 2015-04-23 DIAGNOSIS — Z8619 Personal history of other infectious and parasitic diseases: Secondary | ICD-10-CM | POA: Diagnosis not present

## 2015-04-23 DIAGNOSIS — Z6833 Body mass index (BMI) 33.0-33.9, adult: Secondary | ICD-10-CM | POA: Diagnosis not present

## 2015-04-23 DIAGNOSIS — Z0000 Encounter for general adult medical examination without abnormal findings: Secondary | ICD-10-CM | POA: Diagnosis not present

## 2015-04-23 DIAGNOSIS — R109 Unspecified abdominal pain: Secondary | ICD-10-CM | POA: Diagnosis not present

## 2015-04-29 ENCOUNTER — Other Ambulatory Visit: Payer: Self-pay | Admitting: Internal Medicine

## 2015-04-29 DIAGNOSIS — Z1231 Encounter for screening mammogram for malignant neoplasm of breast: Secondary | ICD-10-CM

## 2015-05-17 ENCOUNTER — Ambulatory Visit: Payer: Self-pay

## 2015-05-21 ENCOUNTER — Ambulatory Visit
Admission: RE | Admit: 2015-05-21 | Discharge: 2015-05-21 | Disposition: A | Discharge status: Home / Self Care | Payer: Medicare Other | Source: Ambulatory Visit | Attending: Internal Medicine | Admitting: Internal Medicine

## 2015-05-21 DIAGNOSIS — Z1231 Encounter for screening mammogram for malignant neoplasm of breast: Secondary | ICD-10-CM | POA: Diagnosis not present

## 2015-10-21 DIAGNOSIS — M545 Low back pain: Secondary | ICD-10-CM | POA: Diagnosis not present

## 2015-10-21 DIAGNOSIS — I10 Essential (primary) hypertension: Secondary | ICD-10-CM | POA: Diagnosis not present

## 2015-10-21 DIAGNOSIS — R109 Unspecified abdominal pain: Secondary | ICD-10-CM | POA: Diagnosis not present

## 2015-10-21 DIAGNOSIS — M8580 Other specified disorders of bone density and structure, unspecified site: Secondary | ICD-10-CM | POA: Diagnosis not present

## 2015-10-22 DIAGNOSIS — R109 Unspecified abdominal pain: Secondary | ICD-10-CM | POA: Diagnosis not present

## 2016-03-31 DIAGNOSIS — R072 Precordial pain: Secondary | ICD-10-CM | POA: Diagnosis not present

## 2016-03-31 DIAGNOSIS — R300 Dysuria: Secondary | ICD-10-CM | POA: Diagnosis not present

## 2016-04-05 ENCOUNTER — Telehealth (HOSPITAL_COMMUNITY): Payer: Self-pay | Admitting: *Deleted

## 2016-04-05 ENCOUNTER — Other Ambulatory Visit: Payer: Self-pay | Admitting: Internal Medicine

## 2016-04-05 DIAGNOSIS — R072 Precordial pain: Secondary | ICD-10-CM

## 2016-04-05 NOTE — Telephone Encounter (Signed)
No DPR on file. Language line called and no interpreter available. Patients contact # called and daughter Eugenia answeIsabelle Courser phone. She stated she interprets for her mother. Patient's daughter given detailed instructions per Myocardial Perfusion Study Information Sheet for the test on 04/10/16 at 0745. Patient notified to arrive 15 minutes early and that it is imperative to arrive on time for appointment to keep from having the test rescheduled.  If you need to cancel or reschedule your appointment, please call the office within 24 hours of your appointment. Failure to do so may result in a cancellation of your appointment, and a $50 no show fee. Patient verbalized understanding.Anjolina Byrer, Adelene IdlerCynthia W

## 2016-04-10 ENCOUNTER — Ambulatory Visit (HOSPITAL_COMMUNITY): Payer: Medicare Other | Attending: Cardiology

## 2016-04-10 DIAGNOSIS — R002 Palpitations: Secondary | ICD-10-CM | POA: Diagnosis not present

## 2016-04-10 DIAGNOSIS — R072 Precordial pain: Secondary | ICD-10-CM

## 2016-04-10 DIAGNOSIS — I10 Essential (primary) hypertension: Secondary | ICD-10-CM | POA: Insufficient documentation

## 2016-04-10 DIAGNOSIS — R0609 Other forms of dyspnea: Secondary | ICD-10-CM | POA: Diagnosis not present

## 2016-04-10 DIAGNOSIS — I2510 Atherosclerotic heart disease of native coronary artery without angina pectoris: Secondary | ICD-10-CM | POA: Diagnosis present

## 2016-04-10 LAB — MYOCARDIAL PERFUSION IMAGING
CHL CUP NUCLEAR SRS: 0
CSEPPHR: 95 {beats}/min
LV dias vol: 58 mL (ref 46–106)
LV sys vol: 13 mL
NUC STRESS TID: 0.84
RATE: 0.34
Rest HR: 73 {beats}/min
SDS: 0
SSS: 0

## 2016-04-10 MED ORDER — REGADENOSON 0.4 MG/5ML IV SOLN
0.4000 mg | Freq: Once | INTRAVENOUS | Status: AC
  Administered 2016-04-10: 0.4 mg via INTRAVENOUS

## 2016-04-10 MED ORDER — TECHNETIUM TC 99M TETROFOSMIN IV KIT
31.6000 | PACK | Freq: Once | INTRAVENOUS | Status: AC | PRN
  Administered 2016-04-10: 31.6 via INTRAVENOUS
  Filled 2016-04-10: qty 32

## 2016-04-10 MED ORDER — TECHNETIUM TC 99M TETROFOSMIN IV KIT
10.6000 | PACK | Freq: Once | INTRAVENOUS | Status: AC | PRN
  Administered 2016-04-10: 10.6 via INTRAVENOUS
  Filled 2016-04-10: qty 11

## 2016-04-10 NOTE — Progress Notes (Unsigned)
Interpreter not available for patient. [phone]. He spoke with patient and she gave permission for her daughter to interpret Renee Harderugenie Uwintanage. She interpreted the procedure for her mother and waiver of right signed by all parties involved.Vernee Baines, Adelene IdlerCynthia W

## 2016-04-25 DIAGNOSIS — Z23 Encounter for immunization: Secondary | ICD-10-CM | POA: Diagnosis not present

## 2016-04-25 DIAGNOSIS — Z1389 Encounter for screening for other disorder: Secondary | ICD-10-CM | POA: Diagnosis not present

## 2016-04-25 DIAGNOSIS — Z0000 Encounter for general adult medical examination without abnormal findings: Secondary | ICD-10-CM | POA: Diagnosis not present

## 2016-04-25 DIAGNOSIS — M8580 Other specified disorders of bone density and structure, unspecified site: Secondary | ICD-10-CM | POA: Diagnosis not present

## 2016-04-25 DIAGNOSIS — I10 Essential (primary) hypertension: Secondary | ICD-10-CM | POA: Diagnosis not present

## 2016-04-25 DIAGNOSIS — Z8619 Personal history of other infectious and parasitic diseases: Secondary | ICD-10-CM | POA: Diagnosis not present

## 2016-08-03 DIAGNOSIS — B1920 Unspecified viral hepatitis C without hepatic coma: Secondary | ICD-10-CM | POA: Diagnosis not present

## 2016-08-03 DIAGNOSIS — R109 Unspecified abdominal pain: Secondary | ICD-10-CM | POA: Diagnosis not present

## 2016-08-04 ENCOUNTER — Other Ambulatory Visit: Payer: Self-pay | Admitting: Internal Medicine

## 2016-08-04 ENCOUNTER — Ambulatory Visit
Admission: RE | Admit: 2016-08-04 | Discharge: 2016-08-04 | Disposition: A | Discharge status: Home / Self Care | Payer: Medicare Other | Source: Ambulatory Visit | Attending: Internal Medicine | Admitting: Internal Medicine

## 2016-08-04 DIAGNOSIS — R109 Unspecified abdominal pain: Secondary | ICD-10-CM | POA: Diagnosis not present

## 2016-08-10 DIAGNOSIS — K5901 Slow transit constipation: Secondary | ICD-10-CM | POA: Diagnosis not present

## 2016-08-10 DIAGNOSIS — Z8619 Personal history of other infectious and parasitic diseases: Secondary | ICD-10-CM | POA: Diagnosis not present

## 2016-10-24 DIAGNOSIS — B1920 Unspecified viral hepatitis C without hepatic coma: Secondary | ICD-10-CM | POA: Diagnosis not present

## 2016-10-24 DIAGNOSIS — I10 Essential (primary) hypertension: Secondary | ICD-10-CM | POA: Diagnosis not present

## 2016-10-24 DIAGNOSIS — K5901 Slow transit constipation: Secondary | ICD-10-CM | POA: Diagnosis not present

## 2016-11-14 DIAGNOSIS — M8588 Other specified disorders of bone density and structure, other site: Secondary | ICD-10-CM | POA: Diagnosis not present

## 2017-02-28 DIAGNOSIS — R309 Painful micturition, unspecified: Secondary | ICD-10-CM | POA: Diagnosis not present

## 2017-04-26 DIAGNOSIS — Z0000 Encounter for general adult medical examination without abnormal findings: Secondary | ICD-10-CM | POA: Diagnosis not present

## 2017-04-26 DIAGNOSIS — Z8619 Personal history of other infectious and parasitic diseases: Secondary | ICD-10-CM | POA: Diagnosis not present

## 2017-04-26 DIAGNOSIS — E663 Overweight: Secondary | ICD-10-CM | POA: Diagnosis not present

## 2017-04-26 DIAGNOSIS — I10 Essential (primary) hypertension: Secondary | ICD-10-CM | POA: Diagnosis not present

## 2017-04-26 DIAGNOSIS — K5901 Slow transit constipation: Secondary | ICD-10-CM | POA: Diagnosis not present

## 2017-04-26 DIAGNOSIS — D649 Anemia, unspecified: Secondary | ICD-10-CM | POA: Diagnosis not present

## 2017-04-26 DIAGNOSIS — Z23 Encounter for immunization: Secondary | ICD-10-CM | POA: Diagnosis not present

## 2017-04-26 DIAGNOSIS — Z1389 Encounter for screening for other disorder: Secondary | ICD-10-CM | POA: Diagnosis not present

## 2017-05-02 DIAGNOSIS — D649 Anemia, unspecified: Secondary | ICD-10-CM | POA: Diagnosis not present

## 2017-06-19 ENCOUNTER — Encounter: Payer: Self-pay | Admitting: Internal Medicine

## 2017-06-19 ENCOUNTER — Ambulatory Visit (INDEPENDENT_AMBULATORY_CARE_PROVIDER_SITE_OTHER): Payer: Medicare Other | Admitting: Internal Medicine

## 2017-06-19 VITALS — Ht 64.0 in | Wt 185.0 lb

## 2017-06-19 DIAGNOSIS — B182 Chronic viral hepatitis C: Secondary | ICD-10-CM | POA: Diagnosis not present

## 2017-06-19 NOTE — Progress Notes (Signed)
[facility]      Reason for Consult: chronic hepatitis C    Referring Physician: Dr. Nehemiah Settle    Patient ID: Karen Wilson, female    DOB: 1940-12-22, 77 y.o.   MRN: [id number]  HPI:   She comes in for evaluation for a history of chronic hepatitis C.  Her daughter is the interpretor.  She reports that about 10 years ago she was managed by Dr. Randa Evens for chronic hepatitis C and treated at that time.  She does not recall all of the details of the treatment but her and her daughter do remember weekly shots for about 6 months.  She does not remember any pills.  She continued follow up with Dr. Randa Evens until she was released from his care.  She does not recall any liver biopsy or if there were concerns on her liver.  She does think she had an SVR 24 test but not certain.   Previous record reviewed from Epic and no biopsy results noted.   PMH: Chronic hepatitis C, IBS, constipation, hypertension, osteoporosis  Prior to Admission medications   Medication Sig Start Date End Date Taking? Authorizing Provider  acetaminophen (TYLENOL) 500 MG tablet 1 tablet as needed    [provider]  aspirin (ASPIR-81) 81 MG EC tablet 1 tablet    [provider]  Calcium Carb-Cholecalciferol (CALCIUM 1000 + D) 1000-800 MG-UNIT TABS 1 tab    [provider]  loratadine (CLARITIN) 10 MG tablet TAKE 1 TABLET EVERY DAY AS NEEDED FOR ALLERGY SYMPTOMS 05/27/17   [provider]  losartan (COZAAR) 100 MG tablet Take 100 mg by mouth daily. 05/17/17   [provider]  omeprazole (PRILOSEC) 40 MG capsule take 1 capsule (40 mg) by oral route once daily before a meal for 30 days    [provider]  oxybutynin (DITROPAN) 5 MG tablet Take 5 mg by mouth 2 (two) times daily. 05/16/17   [provider]  polyethylene glycol (MIRALAX / GLYCOLAX) packet MIX 1 PACKET IN 8 OUNCES OF WATER,JUICE,SODA, COFFEE OR TEA AND DRINK BY MOUTH DAILY  04/23/17   [provider]  polyethylene glycol powder (GLYCOLAX/MIRALAX) powder MIX 1 PACKET IN 8 OUNCES OF WATER,JUICE,SODA, COFFEE OR TEA AND DRINK BY MOUTH DAILY    [provider]  traMADol (ULTRAM) 50 MG tablet take 1 tablet by mouth as needed for pain    [provider]    No Known Allergies  Social History   Tobacco Use  . Smoking status: Former Games developer  . Smokeless tobacco: Never Used  Substance Use Topics  . Alcohol use: No    Frequency: Never  . Drug use: No    FMH: father and mother deceased;   Review of Systems  Constitutional: negative for fatigue and malaise Gastrointestinal: negative for diarrhea Integument/breast: negative for rash All other systems reviewed and are negative    Constitutional: in no apparent distress  Vitals:   06/19/17 1459  Weight: 185 lb (83.9 kg)  Height: 5\' 4"  (1.626 m)   EYES: anicteric ENMT: Cardiovascular: Cor RRR Respiratory: CTA B; normal respiratory effort GI: Bowel sounds are normal, liver is not enlarged, spleen is not enlarged Musculoskeletal: no pedal edema noted Skin: negatives: no rash Hematologic: no cervical lad  Labs: Lab Results  Component Value Date   WBC 5.2 11/05/2007   HGB 14.5 11/05/2007   HCT 44.6 11/05/2007   MCV 85.3 11/05/2007   PLT 164 11/05/2007  Lab Results  Component Value Date   CREATININE 0.71 06/30/2011   BUN 17 11/05/2007   NA 142 11/05/2007   K 4.7 11/05/2007   CL 105 11/05/2007   CO2 25 11/05/2007    Lab Results  Component Value Date   ALT 35 11/05/2007   AST 36 11/05/2007   ALKPHOS 46 11/05/2007   BILITOT 0.5 11/05/2007     Assessment: Chronic hepatitis C, s/p treatment.  I am unlcear if she had a successful SVR 24 after treatment. No previous records to go by. I will check her hepatitis C viral load as well as an elastography to determine if she has any concerning findings with her liver.  Labs from Dr. Nehemiah SettlePolite show a normal platelet level, normal  albumin, normal T bili.    Plan: 1) hepatitis C viral load level 2) elastography  Return for results after completion and discuss if further treatment indicated.

## 2017-06-21 LAB — HEPATITIS C RNA QUANTITATIVE
HCV Quantitative Log: 6.86 Log IU/mL — ABNORMAL HIGH
HCV RNA, PCR, QN: 7210000 [IU]/mL — AB

## 2017-06-29 ENCOUNTER — Ambulatory Visit (HOSPITAL_COMMUNITY)
Admission: RE | Admit: 2017-06-29 | Discharge: 2017-06-29 | Disposition: A | Discharge status: Home / Self Care | Payer: Medicare Other | Source: Ambulatory Visit | Attending: Internal Medicine | Admitting: Internal Medicine

## 2017-06-29 DIAGNOSIS — B182 Chronic viral hepatitis C: Secondary | ICD-10-CM | POA: Diagnosis present

## 2017-07-03 ENCOUNTER — Encounter: Payer: Self-pay | Admitting: Internal Medicine

## 2017-07-03 ENCOUNTER — Ambulatory Visit (INDEPENDENT_AMBULATORY_CARE_PROVIDER_SITE_OTHER): Payer: Medicare Other | Admitting: Internal Medicine

## 2017-07-03 VITALS — BP 137/71 | HR 73 | Temp 98.2°F | Resp 18 | Ht 64.0 in | Wt 185.0 lb

## 2017-07-03 DIAGNOSIS — B182 Chronic viral hepatitis C: Secondary | ICD-10-CM | POA: Diagnosis not present

## 2017-07-03 DIAGNOSIS — K7460 Unspecified cirrhosis of liver: Secondary | ICD-10-CM

## 2017-07-03 DIAGNOSIS — Z7185 Encounter for immunization safety counseling: Secondary | ICD-10-CM | POA: Insufficient documentation

## 2017-07-03 DIAGNOSIS — Z7189 Other specified counseling: Secondary | ICD-10-CM | POA: Diagnosis not present

## 2017-07-03 NOTE — Assessment & Plan Note (Signed)
Confirmed active disease.  Will complete labs and once genotype available will prescribe appropriate medication, likely Harvoni or Epclusa for 12 weeks.  Not eligible for 8 weeks with cirrhosis, previous treatment.

## 2017-07-03 NOTE — Progress Notes (Signed)
   Subjective:    Patient ID: Karen Wilson, female    DOB: 02/27/1941, 77 y.o.   MRN: [id number]  HPI Here for follow up of chronic hepatitis C. She has a history of chronic hepatitis C and was treated with shots (presumably PegInt) and does not recall if she also took pills.  She remembers completing about 6 months and stopping.  She thinks she had follow up blood tests but not sure and no records available.     At the time of her first visit, I did not have the hepatitis C quantitation done by her PCP but was available after the visit.  I therefore had tested her and she has detectable HCV viral RNA confirming active disease, likely relapse from previous treatment.  She also has F3/4 on elastography and some coarse echotexture.  No associated fatigue.  Interested in retreatment.     Review of Systems  Gastrointestinal: Negative for diarrhea.  Skin: Negative for rash.       Objective:   Physical Exam  Constitutional: She appears well-developed and well-nourished. No distress.  Eyes: No scleral icterus.  Cardiovascular: Normal rate, regular rhythm and normal heart sounds.  No murmur heard. Pulmonary/Chest: Effort normal and breath sounds normal. No respiratory distress.  Skin: No rash noted.   SH: no alcohol       Assessment & Plan:

## 2017-07-03 NOTE — Assessment & Plan Note (Signed)
She did have PCV 13 in 2016.  Will check hepatitis A and B and vaccinate if indicated.

## 2017-07-03 NOTE — Assessment & Plan Note (Addendum)
Discussed with her. Risk of hepatocellular carcinoma.  Risk will be greatly reduced with successful HCV treatment.  She will need ongoing HCC screening with ultrasound every 6 months.   Child Pugh A based on [facility] labs (normal platelets, albumin).   After treatment, [facility].

## 2017-07-03 NOTE — Patient Instructions (Signed)
Date 07/03/17  Dear Ms Angert, As discussed in the ID Clinic, your hepatitis C therapy will include highly effective medication(s) for treatment and will vary based on the type of hepatitis C and insurance approval.  Potential medications include:          Harvoni (sofosbuvir 90mg /ledipasvir 400mg ) tablet oral daily          OR     Epclusa (sofosbuvir 400mg /velpatasvir 100mg ) tablet oral daily          OR      Mavyret (glecaprevir 100 mg/pibrentasvir 40 mg): Take 3 tablets oral daily               Medications are typically for 8 or 12 weeks total ---------------------------------------------------------------- Your HCV Treatment Start Date: You will be notified by our office once the medication is approved and where you can pick it up (or if mailed)   ---------------------------------------------------------------- YOUR PHARMACY CONTACT:   [facility] [address] [place], [place] [address] Phone: [phone] Hours: Monday to Friday 7:30 am to 6:00 pm   Please always contact your pharmacy at least 3-4 business days before you run out of medications to ensure your next month's medication is ready or 1 week prior to running out if you receive it by mail.  Remember, each prescription is for 28 days. ---------------------------------------------------------------- GENERAL NOTES REGARDING YOUR HEPATITIS C MEDICATION:  Some medications have the following interactions:  - Acid reducing agents such as H2 blockers (ie. Pepcid (famotidine), Zantac (ranitidine), Tagamet (cimetidine), Axid (nizatidine) and proton pump inhibitors (ie. Prilosec (omeprazole), Protonix (pantoprazole), Nexium (esomeprazole), or Aciphex (rabeprazole)). Do not take until you have discussed with a health care provider.    -Antacids that contain magnesium and/or aluminum hydroxide (ie. Milk of Magensia, Rolaids, Gaviscon, Maalox, Mylanta, an dArthritis Pain Formula).  -Calcium carbonate  (calcium supplements or antacids such as Tums, Caltrate, Os-Cal).  -St. John's wort or any products that contain St. John's wort like some herbal supplements  Please inform the office prior to starting any of these medications.  - The common side effects associated with Harvoni include:      1. Fatigue      2. Headache      3. Nausea      4. Diarrhea      5. Insomnia  Please note that this only lists the most common side effects and is NOT a comprehensive list of the potential side effects of these medications. For more information, please review the drug information sheets that come with your medication package from the pharmacy.  ---------------------------------------------------------------- GENERAL HELPFUL HINTS ON HCV THERAPY: 1. Stay well-hydrated. 2. Notify the ID Clinic of any changes in your other over-the-counter/herbal or prescription medications. 3. If you miss a dose of your medication, take the missed dose as soon as you remember. Return to your regular time/dose schedule the next day.  4.  Do not stop taking your medications without first talking with your healthcare provider. 5.  You may take Tylenol (acetaminophen), as long as the dose is less than 2000 mg (OR no more than 4 tablets of the Tylenol Extra Strengths 500mg  tablet) in 24 hours. 6.  You will see our pharmacist-specialist within the first 2 weeks of starting your medication to monitor for any possible side effects. 7.  You will have labs once during treatment, soon after treatment completion and one final lab 6 months after treatment completion to verify the virus is out of your system.  Molly Maduroobert  Jackolyn Confer, MD  [facility] [address] [place] [address] [place], [place]  [address] [phone]

## 2017-07-04 LAB — COMPLETE METABOLIC PANEL WITH GFR
AG Ratio: 1.3 (calc) (ref 1.0–2.5)
ALBUMIN MSPROF: 4.1 g/dL (ref 3.6–5.1)
ALT: 17 U/L (ref 6–29)
AST: 22 U/L (ref 10–35)
Alkaline phosphatase (APISO): 50 U/L (ref 33–130)
BUN: 14 mg/dL (ref 7–25)
CO2: 29 mmol/L (ref 20–32)
Calcium: 9.5 mg/dL (ref 8.6–10.4)
Chloride: 106 mmol/L (ref 98–110)
Creat: 0.7 mg/dL (ref 0.60–0.93)
GFR, Est African American: 98 mL/min/{1.73_m2} (ref >=60)
GFR, Est Non African American: 85 mL/min/{1.73_m2} (ref >=60)
GLUCOSE: 126 mg/dL — AB (ref 65–99)
Globulin: 3.2 g/dL (calc) (ref 1.9–3.7)
Potassium: 4.9 mmol/L (ref 3.5–5.3)
Sodium: 140 mmol/L (ref 135–146)
Total Bilirubin: 0.6 mg/dL (ref 0.2–1.2)
Total Protein: 7.3 g/dL (ref 6.1–8.1)

## 2017-07-04 LAB — CBC WITH DIFFERENTIAL/PLATELET
BASOS ABS: 21 {cells}/uL (ref 0–200)
Basophils Relative: 0.5 %
Eosinophils Absolute: 202 cells/uL (ref 15–500)
Eosinophils Relative: 4.8 %
HEMATOCRIT: 34.5 % — AB (ref 35.0–45.0)
Hemoglobin: 11 g/dL — ABNORMAL LOW (ref 11.7–15.5)
LYMPHS ABS: 1394 {cells}/uL (ref 850–3900)
MCH: 24.2 pg — ABNORMAL LOW (ref 27.0–33.0)
MCHC: 31.9 g/dL — ABNORMAL LOW (ref 32.0–36.0)
MCV: 76 fL — ABNORMAL LOW (ref 80.0–100.0)
MPV: 12.1 fL (ref 7.5–12.5)
Monocytes Relative: 11.3 %
NEUTROS PCT: 50.2 %
Neutro Abs: 2108 cells/uL (ref 1500–7800)
Platelets: 172 10*3/uL (ref 140–400)
RBC: 4.54 10*6/uL (ref 3.80–5.10)
RDW: 15.1 % — AB (ref 11.0–15.0)
Total Lymphocyte: 33.2 %
WBC mixed population: 475 cells/uL (ref 200–950)
WBC: 4.2 10*3/uL (ref 3.8–10.8)

## 2017-07-04 LAB — HEPATITIS B CORE ANTIBODY, TOTAL: Hep B Core Total Ab: REACTIVE — AB

## 2017-07-04 LAB — PROTIME-INR
INR: 1.1
Prothrombin Time: 11.4 s (ref 9.0–11.5)

## 2017-07-04 LAB — HEPATITIS A ANTIBODY, TOTAL: Hepatitis A AB,Total: REACTIVE — AB

## 2017-07-04 LAB — HEPATITIS B SURFACE ANTIBODY,QUALITATIVE: HEP B S AB: NONREACTIVE

## 2017-07-04 LAB — HIV ANTIBODY (ROUTINE TESTING W REFLEX): HIV 1&2 Ab, 4th Generation: NONREACTIVE

## 2017-07-04 LAB — HEPATITIS B SURFACE ANTIGEN: HEP B S AG: NONREACTIVE

## 2017-07-05 LAB — HEPATITIS C GENOTYPE: HCV Genotype: 4

## 2017-07-06 ENCOUNTER — Other Ambulatory Visit: Payer: Self-pay | Admitting: Internal Medicine

## 2017-07-06 MED ORDER — SOFOSBUVIR-VELPATASVIR 400-100 MG PO TABS: 1.0000 | ORAL_TABLET | Freq: Every day | ORAL | 2 refills | Status: DC

## 2017-07-11 ENCOUNTER — Encounter: Payer: Self-pay | Admitting: Pharmacy Technician

## 2017-07-11 ENCOUNTER — Telehealth: Payer: Self-pay | Admitting: Pharmacist

## 2017-07-11 MED FILL — EPCLUSA 400 MG-100 MG TAB: 400-100 | 28 days supply | Qty: 28 | Fill #0

## 2017-07-11 NOTE — Telephone Encounter (Signed)
Patient was approved for Epclusa x 12 weeks. Spoke with her daughter on the phone and explained to her how her mother should take the medication.  Counseled on taking it once daily at the same time each day.  Encouraged not to miss any doses. Counseled on possible side effects as well.  Patient is also taking omeprazole 40 mg.  Explained the interaction to the daughter.  Patient will stop taking omeprazole while on Epclusa.  Told her to take a teaspoon of mustard to help with heartburn if needed. She will call if her mother struggles with heartburn and needs the treatment. She will follow-up with us early next month and then with Dr. Luciana Axeomer either at E[facility] or for her cure visit since she is F4.

## 2017-08-02 ENCOUNTER — Ambulatory Visit (INDEPENDENT_AMBULATORY_CARE_PROVIDER_SITE_OTHER): Payer: Medicare Other | Admitting: Pharmacist

## 2017-08-02 DIAGNOSIS — B182 Chronic viral hepatitis C: Secondary | ICD-10-CM

## 2017-08-02 NOTE — Progress Notes (Signed)
HPI: Karen Wilson is a 77 y.o. female who presents to the [facility] pharmacy clinic for Hep C follow-up.  She has genotype 4, F4 CP class A, and started 12 weeks of Epclusa on 4/12.  She is also treatment experienced with peg +/- riba.  Patient Active Problem List   Diagnosis Date Noted  . Cirrhosis (HCC) 07/03/2017  . Vaccine counseling 07/03/2017  . BACK PAIN, UPPER 04/05/2010  . CHEST WALL PAIN, ACUTE 04/05/2010  . ELBOW PAIN, LEFT 12/02/2009  . UNSTEADY GAIT 08/17/2009  . CONSTIPATION 05/21/2009  . URINARY URGENCY 02/19/2009  . POLYURIA 08/24/2008  . SACROILIAC STRAIN, ACUTE 08/24/2008  . Pain in joint, shoulder region 11/05/2007  . GANGLION CYST, WRIST, RIGHT 11/05/2007  . PALPITATIONS 11/05/2007  . OSTEOPOROSIS 06/12/2007  . HEMOCCULT POSITIVE STOOL 05/21/2007  . VAGINITIS, CANDIDAL 03/08/2007  . COSTOCHONDRITIS 03/08/2007  . HEMIANOPIA 12/24/2006  . GERD 12/24/2006  . IRRITABLE BOWEL SYNDROME 12/24/2006  . KNEE PAIN, BILATERAL 12/24/2006  . Chronic hepatitis C without hepatic coma (HCC) 12/24/2006    Patient's Medications  New Prescriptions   No medications on file  Previous Medications   ACETAMINOPHEN (TYLENOL) 500 MG TABLET    1 tablet as needed   ASPIRIN (ASPIR-81) 81 MG EC TABLET    1 tablet   CALCIUM CARB-CHOLECALCIFEROL (CALCIUM 1000 + D) 1000-800 MG-UNIT TABS    1 tab   LORATADINE (CLARITIN) 10 MG TABLET    TAKE 1 TABLET EVERY DAY AS NEEDED FOR ALLERGY SYMPTOMS   LOSARTAN (COZAAR) 100 MG TABLET    Take 100 mg by mouth daily.   OMEPRAZOLE (PRILOSEC) 40 MG CAPSULE    take 1 capsule (40 mg) by oral route once daily before a meal for 30 days   OXYBUTYNIN (DITROPAN) 5 MG TABLET    Take 5 mg by mouth 2 (two) times daily.   POLYETHYLENE GLYCOL (MIRALAX / GLYCOLAX) PACKET    MIX 1 PACKET IN 8 OUNCES OF WATER,JUICE,SODA, COFFEE OR TEA AND DRINK BY MOUTH DAILY   POLYETHYLENE GLYCOL POWDER (GLYCOLAX/MIRALAX) POWDER    MIX 1 PACKET IN 8 OUNCES OF WATER,JUICE,SODA,  COFFEE OR TEA AND DRINK BY MOUTH DAILY   SOFOSBUVIR-VELPATASVIR (EPCLUSA) 400-100 MG TABS    Take 1 tablet by mouth daily.   TRAMADOL (ULTRAM) 50 MG TABLET    take 1 tablet by mouth as needed for pain  Modified Medications   No medications on file  Discontinued Medications   No medications on file    Allergies: No Known Allergies  Past Medical History: No past medical history on file.  Social History: Social History   Socioeconomic History  . Marital status: Widowed    Spouse name: Not on file  . Number of children: Not on file  . Years of education: Not on file  . Highest education level: Not on file  Occupational History  . Not on file  Social Needs  . Financial resource strain: Not on file  . Food insecurity:    Worry: Not on file    Inability: Not on file  . Transportation needs:    Medical: Not on file    Non-medical: Not on file  Tobacco Use  . Smoking status: Former Games developer  . Smokeless tobacco: Never Used  Substance and Sexual Activity  . Alcohol use: No    Frequency: Never  . Drug use: No  . Sexual activity: Not on file  Lifestyle  . Physical activity:    Days per week: Not on  file    Minutes per session: Not on file  . Stress: Not on file  Relationships  . Social connections:    Talks on phone: Not on file    Gets together: Not on file    Attends religious service: Not on file    Active member of club or organization: Not on file    Attends meetings of clubs or organizations: Not on file    Relationship status: Not on file  Other Topics Concern  . Not on file  Social History Narrative  . Not on file    Labs: Hepatitis C Lab Results  Component Value Date   HCVGENOTYPE 4 07/03/2017   HCVRNAPCRQN 7,210,000 (H) 06/19/2017   Hepatitis B Lab Results  Component Value Date   HEPBSAB NON-REACTIVE 07/03/2017   HEPBSAG NON-REACTIVE 07/03/2017   HEPBCAB REACTIVE (A) 07/03/2017   Hepatitis A Lab Results  Component Value Date   HAV REACTIVE (A)  07/03/2017   HIV Lab Results  Component Value Date   HIV NON-REACTIVE 07/03/2017   HIV NON REAC 03/08/2007   Lab Results  Component Value Date   CREATININE 0.70 07/03/2017   CREATININE 0.71 06/30/2011   CREATININE 0.87 11/05/2007   CREATININE 0.79 03/08/2007   Lab Results  Component Value Date   AST 22 07/03/2017   AST 36 11/05/2007   AST 35 03/08/2007   ALT 17 07/03/2017   ALT 35 11/05/2007   ALT 31 03/08/2007   INR 1.1 07/03/2017    Fibrosis Score: F4 as assessed by [facility]   Child-Pugh Score: A  Assessment: Karen Wilson is here today to follow-up for her Hep C infection.  She is accompanied by her daughter who translates for her.  She started 12 weeks of Epclusa ~3 weeks ago. She experienced some nausea the first 2 days of treatment but it eventually resolved on its own.  No other side effects of note.  She does complain of "tingling in her feet" which I told her was likely not a side effect from [place] and to discuss that with her PCP. She agreed.   No missed doses and takes it at the same time each night at bedtime.  I emphasized the importance of continuing good adherence and she states she will not forget to take her medications. She has stopped the omeprazole while on the Epclusa. She states she has had some heartburn but it is bearable. I will check a Hep C VL and CMET today and have her follow-up with Dr. Luciana Axe at [facility].   Plan: - Continue Epclusa x 12 weeks - Hep C VL and CMET today - F/u with Dr. Luciana Axe 7/15 at 330pm  Karen Wilson L. Kynzli Rease, PharmD, AAHIVP, CPP Infectious Diseases Clinical Pharmacist [facility] 08/02/2017, 3:50 PM

## 2017-08-03 LAB — COMPREHENSIVE METABOLIC PANEL
AG Ratio: 1.4 (calc) (ref 1.0–2.5)
ALT: 11 U/L (ref 6–29)
AST: 17 U/L (ref 10–35)
Albumin: 4.1 g/dL (ref 3.6–5.1)
Alkaline phosphatase (APISO): 41 U/L (ref 33–130)
BILIRUBIN TOTAL: 0.5 mg/dL (ref 0.2–1.2)
BUN: 17 mg/dL (ref 7–25)
CALCIUM: 9.7 mg/dL (ref 8.6–10.4)
CO2: 27 mmol/L (ref 20–32)
Chloride: 101 mmol/L (ref 98–110)
Creat: 0.78 mg/dL (ref 0.60–0.93)
GLUCOSE: 111 mg/dL — AB (ref 65–99)
Globulin: 2.9 g/dL (calc) (ref 1.9–3.7)
Potassium: 4.6 mmol/L (ref 3.5–5.3)
SODIUM: 135 mmol/L (ref 135–146)
Total Protein: 7 g/dL (ref 6.1–8.1)

## 2017-08-03 MED FILL — EPCLUSA 400 MG-100 MG TAB: 400-100 | 28 days supply | Qty: 28 | Fill #1

## 2017-08-04 LAB — HEPATITIS C RNA QUANTITATIVE
HCV QUANT LOG: DETECTED {Log_IU}/mL — AB
HCV RNA, PCR, QN: <15 IU/mL — AB

## 2017-09-03 MED FILL — EPCLUSA 400 MG-100 MG TAB: 400-100 | 28 days supply | Qty: 28 | Fill #2

## 2017-09-25 ENCOUNTER — Ambulatory Visit
Admission: RE | Admit: 2017-09-25 | Discharge: 2017-09-25 | Disposition: A | Discharge status: Home / Self Care | Payer: Medicare Other | Source: Ambulatory Visit | Attending: Internal Medicine | Admitting: Internal Medicine

## 2017-09-25 ENCOUNTER — Other Ambulatory Visit: Payer: Self-pay | Admitting: Internal Medicine

## 2017-09-25 DIAGNOSIS — M542 Cervicalgia: Secondary | ICD-10-CM | POA: Diagnosis not present

## 2017-09-25 DIAGNOSIS — M4722 Other spondylosis with radiculopathy, cervical region: Secondary | ICD-10-CM

## 2017-09-25 DIAGNOSIS — M5412 Radiculopathy, cervical region: Secondary | ICD-10-CM | POA: Diagnosis not present

## 2017-10-15 ENCOUNTER — Ambulatory Visit: Payer: Medicare Other | Admitting: Internal Medicine

## 2017-11-06 DIAGNOSIS — Z6833 Body mass index (BMI) 33.0-33.9, adult: Secondary | ICD-10-CM | POA: Diagnosis not present

## 2017-11-06 DIAGNOSIS — I10 Essential (primary) hypertension: Secondary | ICD-10-CM | POA: Diagnosis not present

## 2017-11-06 DIAGNOSIS — M4722 Other spondylosis with radiculopathy, cervical region: Secondary | ICD-10-CM | POA: Diagnosis not present

## 2017-11-06 DIAGNOSIS — B182 Chronic viral hepatitis C: Secondary | ICD-10-CM | POA: Diagnosis not present

## 2017-11-06 DIAGNOSIS — E663 Overweight: Secondary | ICD-10-CM | POA: Diagnosis not present

## 2017-11-29 ENCOUNTER — Ambulatory Visit (INDEPENDENT_AMBULATORY_CARE_PROVIDER_SITE_OTHER): Payer: Medicare Other | Admitting: Internal Medicine

## 2017-11-29 ENCOUNTER — Encounter: Payer: Self-pay | Admitting: Internal Medicine

## 2017-11-29 VITALS — BP 130/82 | HR 69 | Temp 97.7°F | Ht 61.0 in | Wt 176.4 lb

## 2017-11-29 DIAGNOSIS — B182 Chronic viral hepatitis C: Secondary | ICD-10-CM

## 2017-11-29 DIAGNOSIS — Z7185 Encounter for immunization safety counseling: Secondary | ICD-10-CM

## 2017-11-29 DIAGNOSIS — K7460 Unspecified cirrhosis of liver: Secondary | ICD-10-CM

## 2017-11-29 DIAGNOSIS — Z7189 Other specified counseling: Secondary | ICD-10-CM

## 2017-11-29 NOTE — Addendum Note (Signed)
Addended by: Gardiner BarefootOMER, Nillie Bartolotta W on: 11/29/2017 10:19 AM   Modules accepted: Orders

## 2017-11-29 NOTE — Assessment & Plan Note (Addendum)
elastography with F3/4, no cirrhosis on ultrasound Will continue with HCC screening in the future and will order ultrasound for 1 month I will send back to Dr. Randa EvensEdwards for consideration of EGD with advanced fibrosis for ? Varices screening

## 2017-11-29 NOTE — Progress Notes (Signed)
   Subjective:    Patient ID: Jeffie PollockGatalina Fontanez, female    DOB: 05/14/1940, 77 y.o.   MRN: [id number]  HPI Here for follow up of chronic hepatitis C. She has genotype 4 and treated with 12 weeks of Epclusa and finished in early July.  She did not have any issues with the medication.  No associated fatigue or headaches.  Here for [facility].  Her elastography is F3/4, normal platelets at 172.  Has previously seen Dr. Randa EvensEdwards from GI.       Review of Systems  Constitutional: Negative for fatigue.  Skin: Negative for rash.  Neurological: Negative for headaches.       Objective:   Physical Exam  Constitutional: She appears well-developed and well-nourished. No distress.  HENT:  Mouth/Throat: No oropharyngeal exudate.  Eyes: No scleral icterus.  Cardiovascular: Normal rate, regular rhythm and normal heart sounds.  No murmur heard. Skin: No rash noted.   SH: no alcohol       Assessment & Plan:

## 2017-11-29 NOTE — Assessment & Plan Note (Signed)
Has had pneumovax.  Hepatitis A immune and hepatitis B c/w old disease and not active.

## 2017-11-29 NOTE — Assessment & Plan Note (Signed)
Will check [facility] lab today and rtc 3 months for SVR 12

## 2017-12-04 LAB — COMPLETE METABOLIC PANEL WITH GFR
AG RATIO: 1.6 (calc) (ref 1.0–2.5)
ALKALINE PHOSPHATASE (APISO): 41 U/L (ref 33–130)
ALT: 16 U/L (ref 6–29)
AST: 13 U/L (ref 10–35)
Albumin: 4.1 g/dL (ref 3.6–5.1)
BUN: 19 mg/dL (ref 7–25)
CALCIUM: 9.6 mg/dL (ref 8.6–10.4)
CHLORIDE: 105 mmol/L (ref 98–110)
CO2: 29 mmol/L (ref 20–32)
Creat: 0.82 mg/dL (ref 0.60–0.93)
GFR, Est African American: 81 mL/min/{1.73_m2} (ref >=60)
GFR, Est Non African American: 70 mL/min/{1.73_m2} (ref >=60)
GLOBULIN: 2.6 g/dL (ref 1.9–3.7)
Glucose, Bld: 115 mg/dL — ABNORMAL HIGH (ref 65–99)
POTASSIUM: 4.5 mmol/L (ref 3.5–5.3)
Sodium: 141 mmol/L (ref 135–146)
Total Bilirubin: 0.5 mg/dL (ref 0.2–1.2)
Total Protein: 6.7 g/dL (ref 6.1–8.1)

## 2017-12-04 LAB — HEPATITIS C RNA QUANTITATIVE
HCV Quantitative Log: <1.18 Log IU/mL
HCV RNA, PCR, QN: NOT DETECTED [IU]/mL

## 2017-12-10 ENCOUNTER — Ambulatory Visit
Admission: RE | Admit: 2017-12-10 | Discharge: 2017-12-10 | Disposition: A | Discharge status: Home / Self Care | Payer: Medicare Other | Source: Ambulatory Visit | Attending: Internal Medicine | Admitting: Internal Medicine

## 2017-12-10 DIAGNOSIS — K7460 Unspecified cirrhosis of liver: Secondary | ICD-10-CM | POA: Diagnosis not present

## 2017-12-28 DIAGNOSIS — Z23 Encounter for immunization: Secondary | ICD-10-CM | POA: Diagnosis not present

## 2017-12-28 DIAGNOSIS — M25512 Pain in left shoulder: Secondary | ICD-10-CM | POA: Diagnosis not present

## 2017-12-28 DIAGNOSIS — M5412 Radiculopathy, cervical region: Secondary | ICD-10-CM | POA: Diagnosis not present

## 2018-01-04 ENCOUNTER — Other Ambulatory Visit: Payer: Self-pay | Admitting: Internal Medicine

## 2018-01-04 DIAGNOSIS — M25512 Pain in left shoulder: Secondary | ICD-10-CM | POA: Diagnosis not present

## 2018-01-04 DIAGNOSIS — M5412 Radiculopathy, cervical region: Secondary | ICD-10-CM

## 2018-01-07 ENCOUNTER — Other Ambulatory Visit: Payer: Self-pay

## 2018-01-07 ENCOUNTER — Ambulatory Visit: Payer: Medicare Other | Attending: Orthopaedic Surgery | Admitting: Physical Therapy

## 2018-01-07 ENCOUNTER — Encounter: Payer: Self-pay | Admitting: Physical Therapy

## 2018-01-07 DIAGNOSIS — M542 Cervicalgia: Secondary | ICD-10-CM | POA: Insufficient documentation

## 2018-01-07 DIAGNOSIS — R293 Abnormal posture: Secondary | ICD-10-CM | POA: Diagnosis not present

## 2018-01-07 NOTE — Therapy (Signed)
[facility] [address] [place], [place], [address] Phone: [phone]   Fax:  [phone]  Physical Therapy Evaluation  Patient Details  Name: Karen Wilson MRN: [id number] Date of Birth: 10/12/1940 Referring Provider (PT): Ramond Marrow MD   Encounter Date: 01/07/2018  PT End of Session - 01/07/18 1010    Visit Number  1    Number of Visits  13    Date for PT Re-Evaluation  02/18/18    Authorization Type  MCR: Kx mod by 15th visit, Progress not by 10th visit    PT Start Time  0930    PT Stop Time  1016    PT Time Calculation (min)  46 min    Activity Tolerance  Patient tolerated treatment well    Behavior During Therapy  [facility] for tasks assessed/performed       Past Medical History:  Diagnosis Date  . Arthritis     History reviewed. No pertinent surgical history.  There were no vitals filed for this visit.   Subjective Assessment - 01/07/18 0942    Subjective  pt is a 77 y.o F with CC of neck and shoulder pain. neck pain started 3-4 months ago with no specific onset in the L side of the neck and is worse with leaning the head to the L. She reports pain starts in the neck and refers down the arm into the wrist and hand an all fingers. reports pain inthe arm and tingling in the fingers. She reports no hx or neck or shoulder pain prior to this. since onset she reports the pain seems to fluctuate but overall has stayed about the same.  She has a hx of getting injections in the shoulder last thursdya and friday with pain relief but it cotinues to hurt with head movements especially to the left.     How long can you sit comfortably?  unlimited    How long can you stand comfortably?  unlimited    How long can you walk comfortably?  unlimited    Diagnostic tests  MRI ordered but noted completed    Patient Stated Goals  to go back to normal and decrease pain     Currently in Pain?  Yes    Pain Score  7    at worst 9/10   Pain Location  Neck    Pain Orientation  Left    Pain Descriptors / Indicators  Sharp;Tightness    Pain Type  Chronic pain    Pain Radiating Towards  into the hand and fingers of the on the LUE    Pain Onset  More than a month ago    Pain Frequency  Intermittent    Aggravating Factors   bending forward to do the shoes. leaning the head to the left.     Pain Relieving Factors  ice, heat,          [facility] PT Assessment - 01/07/18 0937      Assessment   Medical Diagnosis  L neck and shoulder pain      Referring Provider (PT)  Ramond Marrow MD    Onset Date/Surgical Date  --   3-4 months ago   Hand Dominance  Right    Next MD Visit  02/22/2018    Prior Therapy  yes   a few years ago for another injury but unable to report     Precautions   Precautions  None      Restrictions  Weight Bearing Restrictions  No      Balance Screen   Has the patient fallen in the past 6 months  No    Has the patient had a decrease in activity level because of a fear of falling?   No    Is the patient reluctant to leave their home because of a fear of falling?   No      Home Environment   Living Environment  Private residence    Living Arrangements  Alone    Type of Home  Apartment    Home Access  Level entry    Home Layout  Two level    Alternate Level Stairs-Number of Steps  10    Alternate Level Stairs-Rails  Right   ascending   Home Equipment  Cane - single point      Prior Function   Level of Independence  Independent with basic ADLs    Vocation  Retired      IT consultant   Overall Cognitive Status  Within Functional Limits for tasks assessed      Observation/Other Assessments   Focus on Therapeutic Outcomes (FOTO)   58% limited   predicted 43% limited     Sensation   Light Touch  Appears Intact      Posture/Postural Control   Posture/Postural Control  Postural limitations    Postural Limitations  Rounded Shoulders;Forward head      ROM / Strength   AROM / PROM / Strength   AROM;Strength;PROM      AROM   Overall AROM Comments  pt was able to look to the L further during subject assessment was obtained    AROM Assessment Site  Cervical;Shoulder    Cervical Flexion  40   ERP with referred pain down the LUE   Cervical Extension  10   ERP with referred pain down the arm   Cervical - Right Side Bend  10   ERP, referred pain down the LUE   Cervical - Left Side Bend  20   ERP, referred pain down the LUE   Cervical - Right Rotation  50   ERP, referred pain down the LUE   Cervical - Left Rotation  40      Strength   Strength Assessment Site  Shoulder    Right/Left Shoulder  Right;Left      Palpation   Spinal mobility  hypomobility of C3-C7 with referral of symptoms to LUE    Palpation comment  TTP along bil upper trap/ levator scap with L>R, sub-occipitals      Special Tests    Special Tests  Cervical    Cervical Tests  other;Dictraction;Spurling's      Spurling's   Findings  Positive    Comment  LUE      Distraction Test   Findngs  Positive    Comment  LUE      other    Findings  Positive    Comment  decreased rotation to the L compared L       Ambulation/Gait   Ambulation/Gait  Yes    Gait Pattern  Within Functional Limits                Objective measurements completed on examination: See above findings.      [facility] Adult PT Treatment/Exercise - 01/07/18 0937      Exercises   Exercises  Neck      Neck Exercises: Seated   Other Seated Exercise  upper cervical  rotation 1 x 10 looking to the L / R with 4 fingers    Other Seated Exercise  scapular retraction 1 x 10, demonstration and tactile cues for proper form to prevent hiking the shoulders.       Neck Exercises: Supine   Neck Retraction  10 reps;5 secs      Neck Exercises: Stretches   Upper Trapezius Stretch  2 reps;Left;30 seconds             PT Education - 01/07/18 1016    Education Details  evaluation findings, POC, goals, HEP with proper form/ rationale,  anatomy of the area involved. Benefit of posture keeping shoulders down to reduce overactivation of muscle and release tension.    Person(s) Educated  Patient;Child(ren)    Methods  Explanation;Verbal cues;Demonstration    Comprehension  Verbalized understanding;Verbal cues required;Returned demonstration       PT Short Term Goals - 01/07/18 1047      PT SHORT TERM GOAL #1   Title  pt to be I with inital HEP     Time  3    Period  Weeks    Target Date  01/28/18      PT SHORT TERM GOAL #2   Title  pt to verbalize and demo proper posture to prevent and reduce neck and LUE referral symptoms    Time  3    Period  Weeks    Status  New    Target Date  01/28/18        PT Long Term Goals - 01/07/18 1047      PT LONG TERM GOAL #1   Title  pt to increase cervical flexion/ extension by >/= 10 degrees and bil sidebending to >/= 30 degrees and bil  Rotation to >/= 60 degrees for functional cervical mobility    Time  6    Period  Weeks    Status  New    Target Date  02/18/18      PT LONG TERM GOAL #2   Title  pt will report no LUE referral symptoms with cervical movement for >/= 1 week for improvement in condition     Time  6    Period  Weeks    Status  New    Target Date  02/18/18      PT LONG TERM GOAL #3   Title  increase FOTO score to </=43% limited to demo improvement in function     Time  6    Period  Weeks    Status  New    Target Date  02/18/18      PT LONG TERM GOAL #4   Title  pt will return to normal household ADLs such as vacumming and lifting and carrying activities reporting </= 2/10 pain and no tingling in the L hand    Time  6    Period  Weeks    Status  New    Target Date  02/18/18      PT LONG TERM GOAL #5   Title  pt will be I with all HEP given as of last visit to maintain and progress current level of function              Plan - 01/07/18 1017    Clinical Impression Statement  pt presents to OPPT with CC of neck pain with referral LUE that  started 3-4 months ago with no specific onset. limited neck ROM in all planes with LUE  referral noted in all cervical motions. she demonstrates 4/4 positive cervical radiculpathy cluster testing suggesting high probably of involvment. she would benefit from physical therapy to decrease muscle spasm, promote cerivcal mobility, reduce LUE referral symptoms and maximize her function by addressing the defictis listed.     History and Personal Factors relevant to plan of care:  pt lives alone and speaks predominatley kinyarwanda    Clinical Presentation  Evolving    Clinical Presentation due to:  limited neck ROM, RUE referred pain, fluctuating symptoms, abnormal posture    Clinical Decision Making  Moderate    Rehab Potential  Good    PT Frequency  2x / week    PT Duration  6 weeks    PT Treatment/Interventions  ADLs/Self Care Home Management;Cryotherapy;Traction;Moist Heat;Iontophoresis 4mg /ml Dexamethasone;Electrical Stimulation;Ultrasound;Neuromuscular re-education;Therapeutic activities;Therapeutic exercise;Manual techniques;Taping;Passive range of motion;Dry needling;Patient/family education    PT Next Visit Plan  review/ update HEP, cervical mobs, STW upper trap and surrounding musculature, posture education    PT Home Exercise Plan  upper cervical rotation, upper trap stretch, chin tucks, scapular retraction.    Consulted and Agree with Plan of Care  Patient       Patient will benefit from skilled therapeutic intervention in order to improve the following deficits and impairments:  Abnormal gait, Pain, Impaired sensation, Increased fascial restricitons, Increased muscle spasms, Decreased activity tolerance, Decreased endurance, Decreased balance, Postural dysfunction, Improper body mechanics, Decreased range of motion, Decreased strength, Impaired UE functional use  Visit Diagnosis: Cervicalgia  Abnormal posture     Problem List Patient Active Problem List   Diagnosis Date Noted  .  Cirrhosis (HCC) 07/03/2017  . Vaccine counseling 07/03/2017  . UNSTEADY GAIT 08/17/2009  . URINARY URGENCY 02/19/2009  . POLYURIA 08/24/2008  . Pain in joint, shoulder region 11/05/2007  . GANGLION CYST, WRIST, RIGHT 11/05/2007  . PALPITATIONS 11/05/2007  . OSTEOPOROSIS 06/12/2007  . GERD 12/24/2006  . IRRITABLE BOWEL SYNDROME 12/24/2006  . Chronic hepatitis C without hepatic coma (HCC) 12/24/2006   Lulu Riding PT, DPT, LAT, ATC  01/07/18  10:52 AM      [facility] [address] [place], [place], [address] Phone: [phone]   Fax:  [phone]  Name: Karlyn Glasco MRN: [id number] Date of Birth: 20-Dec-1940

## 2018-01-18 ENCOUNTER — Ambulatory Visit
Admission: RE | Admit: 2018-01-18 | Discharge: 2018-01-18 | Disposition: A | Discharge status: Home / Self Care | Payer: Medicare Other | Source: Ambulatory Visit | Attending: Internal Medicine | Admitting: Internal Medicine

## 2018-01-18 DIAGNOSIS — M5412 Radiculopathy, cervical region: Secondary | ICD-10-CM

## 2018-01-18 DIAGNOSIS — M4802 Spinal stenosis, cervical region: Secondary | ICD-10-CM | POA: Diagnosis not present

## 2018-01-24 DIAGNOSIS — K219 Gastro-esophageal reflux disease without esophagitis: Secondary | ICD-10-CM | POA: Diagnosis not present

## 2018-01-24 DIAGNOSIS — E663 Overweight: Secondary | ICD-10-CM | POA: Diagnosis not present

## 2018-01-24 DIAGNOSIS — K589 Irritable bowel syndrome without diarrhea: Secondary | ICD-10-CM | POA: Diagnosis not present

## 2018-01-24 DIAGNOSIS — Z6834 Body mass index (BMI) 34.0-34.9, adult: Secondary | ICD-10-CM | POA: Diagnosis not present

## 2018-01-24 DIAGNOSIS — M545 Low back pain: Secondary | ICD-10-CM | POA: Diagnosis not present

## 2018-01-24 DIAGNOSIS — Z8619 Personal history of other infectious and parasitic diseases: Secondary | ICD-10-CM | POA: Diagnosis not present

## 2018-01-25 ENCOUNTER — Ambulatory Visit: Payer: Medicare Other | Admitting: Physical Therapy

## 2018-01-25 ENCOUNTER — Encounter: Payer: Self-pay | Admitting: Physical Therapy

## 2018-01-25 DIAGNOSIS — R293 Abnormal posture: Secondary | ICD-10-CM

## 2018-01-25 DIAGNOSIS — M542 Cervicalgia: Secondary | ICD-10-CM | POA: Diagnosis not present

## 2018-01-25 NOTE — Patient Instructions (Signed)

## 2018-01-25 NOTE — Therapy (Signed)
[facility] [address] [place], [place], [address] Phone: [phone]   Fax:  [phone]  Physical Therapy Treatment  Patient Details  Name: Karen Wilson MRN: [id number] Date of Birth: 05-Jul-1940 Referring Provider (PT): Ramond Marrow MD   Encounter Date: 01/25/2018  PT End of Session - 01/25/18 0847    Visit Number  2    Number of Visits  13    Date for PT Re-Evaluation  02/18/18    Authorization Type  MCR: Kx mod by 15th visit, Progress not by 10th visit    PT Start Time  0847    PT Stop Time  0929    PT Time Calculation (min)  42 min    Activity Tolerance  Patient tolerated treatment well    Behavior During Therapy  [facility] for tasks assessed/performed       Past Medical History:  Diagnosis Date  . Arthritis     History reviewed. No pertinent surgical history.  There were no vitals filed for this visit.  Subjective Assessment - 01/25/18 0848    Subjective  "thing are going so far so good, doing the exercises, pt reports continued pain going into the LUE the elbow and lowered "    Currently in Pain?  Yes    Pain Score  8     Pain Orientation  Left    Pain Descriptors / Indicators  Sharp;Tingling;Shooting    Pain Type  Chronic pain    Pain Onset  More than a month ago    Pain Frequency  Intermittent    Aggravating Factors   looking to the L                        [facility] Adult PT Treatment/Exercise - 01/25/18 0851      Self-Care   Self-Care  Posture    Posture  standing and sitting posture to keeping head and shoulders back       Exercises   Exercises  Neck      Neck Exercises: Seated   Other Seated Exercise  upper cervical rotation 1 x 10 looking to the L / R with 4 fingers    Other Seated Exercise  scapular retraction 1 x 10, demonstration and tactile cues.  seated thoracic rotation 2 x 10 with arms  crossed and keep head inline with chest   tactile cues to avoid shoulder hiking      Neck Exercises: Supine   Neck Retraction  10 reps;5 secs      Manual Therapy   Manual Therapy  Joint mobilization;Manual Traction    Manual therapy comments  sub-occipital release x 5 min    taught also with tennis balls   Joint Mobilization  C3-C7 PA and L lateral gapping mobs grade 3, L first rib mobs grade 3 with pt breathing in deeply to promote MWM    Manual Traction  x 5 min      Neck Exercises: Stretches   Upper Trapezius Stretch  2 reps;Left;30 seconds    Upper Trapezius Stretch Limitations  PNF contract relax with 10 sec hold   performed in supine            PT Education - 01/25/18 0917    Education Details  benefits of proper posture and effect of muscle tension with poor posture    Person(s) Educated  Child(ren);Patient    Methods  Explanation;Verbal cues;Handout    Comprehension  Verbalized understanding;Verbal cues  required       PT Short Term Goals - 01/07/18 1047      PT SHORT TERM GOAL #1   Title  pt to be I with inital HEP     Time  3    Period  Weeks    Target Date  01/28/18      PT SHORT TERM GOAL #2   Title  pt to verbalize and demo proper posture to prevent and reduce neck and LUE referral symptoms    Time  3    Period  Weeks    Status  New    Target Date  01/28/18        PT Long Term Goals - 01/07/18 1047      PT LONG TERM GOAL #1   Title  pt to increase cervical flexion/ extension by >/= 10 degrees and bil sidebending to >/= 30 degrees and bil  Rotation to >/= 60 degrees for functional cervical mobility    Time  6    Period  Weeks    Status  New    Target Date  02/18/18      PT LONG TERM GOAL #2   Title  pt will report no LUE referral symptoms with cervical movement for >/= 1 week for improvement in condition     Time  6    Period  Weeks    Status  New    Target Date  02/18/18      PT LONG TERM GOAL #3   Title  increase FOTO score to </=43% limited to demo improvement in function     Time  6    Period  Weeks    Status  New     Target Date  02/18/18      PT LONG TERM GOAL #4   Title  pt will return to normal household ADLs such as vacumming and lifting and carrying activities reporting </= 2/10 pain and no tingling in the L hand    Time  6    Period  Weeks    Status  New    Target Date  02/18/18      PT LONG TERM GOAL #5   Title  pt will be I with all HEP given as of last visit to maintain and progress current level of function             Plan - 01/25/18 0915    Clinical Impression Statement  pt reports improvement of neck mobility but continued LUE referral to the elbow. utilized STW and MTPR techniques of the upper trap/ levator and sub-occipitals followed with mobs and manual distraction which she noted improvement of LUE referral symptoms. discussed posture and benefits of proper posture to relieve muscle tension. end of session she noted improvement of LUE referral     PT Treatment/Interventions  ADLs/Self Care Home Management;Cryotherapy;Traction;Moist Heat;Iontophoresis 4mg /ml Dexamethasone;Electrical Stimulation;Ultrasound;Neuromuscular re-education;Therapeutic activities;Therapeutic exercise;Manual techniques;Taping;Passive range of motion;Dry needling;Patient/family education    PT Next Visit Plan  review/ update HEP, cervical mobs, STW upper trap and surrounding musculature, posture education    PT Home Exercise Plan  upper cervical rotation, upper trap stretch, chin tucks, scapular retraction.    Consulted and Agree with Plan of Care  Patient       Patient will benefit from skilled therapeutic intervention in order to improve the following deficits and impairments:  Abnormal gait, Pain, Impaired sensation, Increased fascial restricitons, Increased muscle spasms, Decreased activity tolerance, Decreased endurance, Decreased balance, Postural  dysfunction, Improper body mechanics, Decreased range of motion, Decreased strength, Impaired UE functional use  Visit Diagnosis: Cervicalgia  Abnormal  posture     Problem List Patient Active Problem List   Diagnosis Date Noted  . Cirrhosis (HCC) 07/03/2017  . Vaccine counseling 07/03/2017  . UNSTEADY GAIT 08/17/2009  . URINARY URGENCY 02/19/2009  . POLYURIA 08/24/2008  . Pain in joint, shoulder region 11/05/2007  . GANGLION CYST, WRIST, RIGHT 11/05/2007  . PALPITATIONS 11/05/2007  . OSTEOPOROSIS 06/12/2007  . GERD 12/24/2006  . IRRITABLE BOWEL SYNDROME 12/24/2006  . Chronic hepatitis C without hepatic coma (HCC) 12/24/2006   Lulu Riding PT, DPT, LAT, ATC  01/25/18  9:31 AM      [facility] [address] [place], [place], [address] Phone: [phone]   Fax:  [phone]  Name: Karen Wilson MRN: [id number] Date of Birth: [birth year]

## 2018-02-01 ENCOUNTER — Encounter: Payer: Self-pay | Admitting: Physical Therapy

## 2018-02-01 ENCOUNTER — Ambulatory Visit: Payer: Medicare Other | Attending: Orthopaedic Surgery | Admitting: Physical Therapy

## 2018-02-01 DIAGNOSIS — R293 Abnormal posture: Secondary | ICD-10-CM | POA: Diagnosis not present

## 2018-02-01 DIAGNOSIS — M542 Cervicalgia: Secondary | ICD-10-CM

## 2018-02-01 NOTE — Therapy (Signed)
[facility] [address] [place], [place], [address] Phone: [phone]   Fax:  [phone]  Physical Therapy Treatment  Patient Details  Name: Karen Wilson MRN: [id number] Date of Birth: 1940-06-28 Referring Provider (PT): Ramond Marrow MD   Encounter Date: 02/01/2018  PT End of Session - 02/01/18 0943    Visit Number  3    Number of Visits  13    Date for PT Re-Evaluation  02/18/18    Authorization Type  MCR: Kx mod by 15th visit, Progress not by 10th visit    PT Start Time  0930    PT Stop Time  1018    PT Time Calculation (min)  48 min       Past Medical History:  Diagnosis Date  . Arthritis     History reviewed. No pertinent surgical history.  There were no vitals filed for this visit.  Subjective Assessment - 02/01/18 0933    Subjective  The pain started going in a better direction after last visit. Daughter reports the patient is having less arm pain with grooming and showering     Currently in Pain?  Yes    Pain Score  6     Pain Location  Neck    Pain Orientation  Left    Pain Radiating Towards  left elbow to hand     Aggravating Factors   bathing, grooming    Pain Relieving Factors  ice, heat                        [facility] Adult PT Treatment/Exercise - 02/01/18 0001      Exercises   Exercises  Neck      Neck Exercises: Seated   Other Seated Exercise  upper cervical rotation 1 x 10 looking to the L / R with 4 fingers    Other Seated Exercise  scapular retraction 1 x 10, demonstration and tactile cues.  seated thoracic rotation 2 x 10 with arms  crossed and keep head inline with chest   tactile cues to avoid shoulder hiking     Neck Exercises: Supine   Neck Retraction  10 reps;5 secs    Other Supine Exercise  supine scap retract- max cues       Modalities   Modalities  Moist Heat      Moist Heat Therapy   Number Minutes Moist Heat  10 Minutes    Moist Heat Location  Cervical       Manual Therapy   Manual therapy comments  left upper trap TPR- referral down arm       Neck Exercises: Stretches   Upper Trapezius Stretch  2 reps;Left;30 seconds    Levator Stretch  2 reps;30 seconds             PT Education - 02/01/18 1007    Education Details  HEP    Person(s) Educated  Patient;Child(ren)    Methods  Explanation;Handout    Comprehension  Verbalized understanding       PT Short Term Goals - 01/07/18 1047      PT SHORT TERM GOAL #1   Title  pt to be I with inital HEP     Time  3    Period  Weeks    Target Date  01/28/18      PT SHORT TERM GOAL #2   Title  pt to verbalize and demo proper posture to prevent and reduce  neck and LUE referral symptoms    Time  3    Period  Weeks    Status  New    Target Date  01/28/18        PT Long Term Goals - 01/07/18 1047      PT LONG TERM GOAL #1   Title  pt to increase cervical flexion/ extension by >/= 10 degrees and bil sidebending to >/= 30 degrees and bil  Rotation to >/= 60 degrees for functional cervical mobility    Time  6    Period  Weeks    Status  New    Target Date  02/18/18      PT LONG TERM GOAL #2   Title  pt will report no LUE referral symptoms with cervical movement for >/= 1 week for improvement in condition     Time  6    Period  Weeks    Status  New    Target Date  02/18/18      PT LONG TERM GOAL #3   Title  increase FOTO score to </=43% limited to demo improvement in function     Time  6    Period  Weeks    Status  New    Target Date  02/18/18      PT LONG TERM GOAL #4   Title  pt will return to normal household ADLs such as vacumming and lifting and carrying activities reporting </= 2/10 pain and no tingling in the L hand    Time  6    Period  Weeks    Status  New    Target Date  02/18/18      PT LONG TERM GOAL #5   Title  pt will be I with all HEP given as of last visit to maintain and progress current level of function             Plan - 02/01/18 1009     Clinical Impression Statement  Pt reports decreasing pain and improved ability to dress, and perform self care with less left arm pain. Reviewed HEP and progressed to yellow scapular bands. No increased pain with bands. Added to HEP. Palpation of left upper trap increased left arm pain.  Trial of HMP to cervical at end of treatment.     PT Next Visit Plan  review/ update HEP, cervical mobs, STW upper trap and surrounding musculature, posture education-review scapular bands     PT Home Exercise Plan  upper cervical rotation, upper trap stretch, chin tucks, scapular retraction. supine yellow band series (except sash)     Consulted and Agree with Plan of Care  Patient       Patient will benefit from skilled therapeutic intervention in order to improve the following deficits and impairments:  Abnormal gait, Pain, Impaired sensation, Increased fascial restricitons, Increased muscle spasms, Decreased activity tolerance, Decreased endurance, Decreased balance, Postural dysfunction, Improper body mechanics, Decreased range of motion, Decreased strength, Impaired UE functional use  Visit Diagnosis: Cervicalgia  Abnormal posture     Problem List Patient Active Problem List   Diagnosis Date Noted  . Cirrhosis (HCC) 07/03/2017  . Vaccine counseling 07/03/2017  . UNSTEADY GAIT 08/17/2009  . URINARY URGENCY 02/19/2009  . POLYURIA 08/24/2008  . Pain in joint, shoulder region 11/05/2007  . GANGLION CYST, WRIST, RIGHT 11/05/2007  . PALPITATIONS 11/05/2007  . OSTEOPOROSIS 06/12/2007  . GERD 12/24/2006  . IRRITABLE BOWEL SYNDROME 12/24/2006  . Chronic hepatitis C without  hepatic coma (HCC) 12/24/2006    Sherrie Mustache, PTA 02/01/2018, 10:13 AM  [facility] [address] [place], [place], [address] Phone: [phone]   Fax:  [phone]  Name: Karen Wilson MRN: [id number] Date of Birth: June 11, 1940

## 2018-02-01 NOTE — Patient Instructions (Signed)
Over Head Pull: Narrow Grip       On back, knees bent, feet flat, band across thighs, elbows straight but relaxed. Pull hands apart (start). Keeping elbows straight, bring arms up and over head, hands toward floor. Keep pull steady on band. Hold momentarily. Return slowly, keeping pull steady, back to start. Repeat _10-20__ times. Band color ___Y___   Side Pull: Double Arm   On back, knees bent, feet flat. Arms perpendicular to body, shoulder level, elbows straight but relaxed. Pull arms out to sides, elbows straight. Resistance band comes across collarbones, hands toward floor. Hold momentarily. Slowly return to starting position. Repeat _10-20__ times. Band color ___Y__    Shoulder Rotation: Double Arm   On back, knees bent, feet flat, elbows tucked at sides, bent 90, hands palms up. Pull hands apart and down toward floor, keeping elbows near sides. Hold momentarily. Slowly return to starting position. Repeat __10-20_ times. Band color __Y____

## 2018-02-08 ENCOUNTER — Ambulatory Visit: Payer: Medicare Other | Admitting: Physical Therapy

## 2018-02-15 ENCOUNTER — Encounter: Payer: Self-pay | Admitting: Physical Therapy

## 2018-02-15 ENCOUNTER — Ambulatory Visit: Payer: Medicare Other | Admitting: Physical Therapy

## 2018-02-15 DIAGNOSIS — M542 Cervicalgia: Secondary | ICD-10-CM | POA: Diagnosis not present

## 2018-02-15 DIAGNOSIS — R293 Abnormal posture: Secondary | ICD-10-CM

## 2018-02-15 NOTE — Therapy (Signed)
[facility] [address] [place], [place], [address] Phone: [phone]   Fax:  [phone]  Physical Therapy Treatment  Patient Details  Name: Karen PollockGatalina Coletti MRN: [id number] Date of Birth: 10/27/1940 Referring Provider (PT): Ramond Marrowax Varkey MD   Encounter Date: 02/15/2018  PT End of Session - 02/15/18 1029    Visit Number  4    Number of Visits  13    Date for PT Re-Evaluation  02/18/18    Authorization Type  MCR: Kx mod by 15th visit, Progress not by 10th visit    PT Start Time  1015    PT Stop Time  1115    PT Time Calculation (min)  60 min       Past Medical History:  Diagnosis Date  . Arthritis     History reviewed. No pertinent surgical history.  There were no vitals filed for this visit.  Subjective Assessment - 02/15/18 1024    Subjective  Overall neck is better and arm pain comes and goes. feel lower arm pain right now.     Currently in Pain?  Yes    Pain Score  6     Pain Location  Arm    Pain Orientation  Lower;Left    Pain Descriptors / Indicators  Shooting    Pain Type  Chronic pain    Aggravating Factors   sleeping on it     Pain Relieving Factors  ice heat          [facility] PT Assessment - 02/15/18 0001      AROM   Cervical - Right Side Bend  38    Cervical - Left Side Bend  20   ERP, referred pain down the LUE   Cervical - Right Rotation  70    Cervical - Left Rotation  55   ERP referred down the LLE                   [facility] - 02/15/18 0001      Self-Care   Other Self-Care Comments   Instructed daughter in STW and TPR to left upper trap       Exercises   Exercises  Neck      Neck Exercises: Standing   Other Standing Exercises  row with green band x 30 -HEP       Neck Exercises: Seated   Other Seated Exercise  upper cervical rotation 1 x 10 looking to the L / R with 4 fingers    Other Seated Exercise  scapular retraction 1 x 10, demonstration  and tactile cues.  seated thoracic rotation 2 x 10 with arms  crossed and keep head inline with chest   tactile cues to avoid shoulder hiking     Neck Exercises: Supine   Neck Retraction  10 reps;5 secs      Modalities   Modalities  Moist Heat      Moist Heat Therapy   Number Minutes Moist Heat  12 Minutes    Moist Heat Location  Cervical;Shoulder      Neck Exercises: Stretches   Upper Trapezius Stretch  2 reps;Left;30 seconds    Levator Stretch  2 reps;30 seconds    Other Neck Stretches  book openings x 10 each way              PT Education - 02/15/18 1126    Education Details  HEP    Person(s) Educated  Patient  Methods  Explanation    Comprehension  Verbalized understanding       PT Short Term Goals - 01/07/18 1047      PT SHORT TERM GOAL #1   Title  pt to be I with inital HEP     Time  3    Period  Weeks    Target Date  01/28/18      PT SHORT TERM GOAL #2   Title  pt to verbalize and demo proper posture to prevent and reduce neck and LUE referral symptoms    Time  3    Period  Weeks    Status  New    Target Date  01/28/18        PT Long Term Goals - 01/07/18 1047      PT LONG TERM GOAL #1   Title  pt to increase cervical flexion/ extension by >/= 10 degrees and bil sidebending to >/= 30 degrees and bil  Rotation to >/= 60 degrees for functional cervical mobility    Time  6    Period  Weeks    Status  New    Target Date  02/18/18      PT LONG TERM GOAL #2   Title  pt will report no LUE referral symptoms with cervical movement for >/= 1 week for improvement in condition     Time  6    Period  Weeks    Status  New    Target Date  02/18/18      PT LONG TERM GOAL #3   Title  increase FOTO score to </=43% limited to demo improvement in function     Time  6    Period  Weeks    Status  New    Target Date  02/18/18      PT LONG TERM GOAL #4   Title  pt will return to normal household ADLs such as vacumming and lifting and carrying activities  reporting </= 2/10 pain and no tingling in the L hand    Time  6    Period  Weeks    Status  New    Target Date  02/18/18      PT LONG TERM GOAL #5   Title  pt will be I with all HEP given as of last visit to maintain and progress current level of function             Plan - 02/15/18 1130    Clinical Impression Statement  Pt reports overall improved however intermittent pain in left UE is still bothersome, Cervical ROM improved however left lateral flexion and rotation create pain down LUE. Instructed daughter in TPR and discussed possible TPDN. Will schedule Re-val with primary PT to continue.     PT Next Visit Plan  review/ update HEP, cervical mobs, STW upper trap and surrounding musculature, posture education-review scapular bands     PT Home Exercise Plan  upper cervical rotation, upper trap stretch, chin tucks, scapular retraction. supine yellow band series (except sash)     Consulted and Agree with Plan of Care  Patient       Patient will benefit from skilled therapeutic intervention in order to improve the following deficits and impairments:  Abnormal gait, Pain, Impaired sensation, Increased fascial restricitons, Increased muscle spasms, Decreased activity tolerance, Decreased endurance, Decreased balance, Postural dysfunction, Improper body mechanics, Decreased range of motion, Decreased strength, Impaired UE functional use  Visit Diagnosis: Cervicalgia  Abnormal posture  Problem List Patient Active Problem List   Diagnosis Date Noted  . Cirrhosis (HCC) 07/03/2017  . Vaccine counseling 07/03/2017  . UNSTEADY GAIT 08/17/2009  . URINARY URGENCY 02/19/2009  . POLYURIA 08/24/2008  . Pain in joint, shoulder region 11/05/2007  . GANGLION CYST, WRIST, RIGHT 11/05/2007  . PALPITATIONS 11/05/2007  . OSTEOPOROSIS 06/12/2007  . GERD 12/24/2006  . IRRITABLE BOWEL SYNDROME 12/24/2006  . Chronic hepatitis C without hepatic coma (HCC) 12/24/2006    Sherrie Mustache, PTA 02/15/2018, 11:36 AM  [facility] [address] [place], [place], [address] Phone: [phone]   Fax:  [phone]  Name: Haley Fuerstenberg MRN: [id number] Date of Birth: May 22, 1940

## 2018-02-22 DIAGNOSIS — M25512 Pain in left shoulder: Secondary | ICD-10-CM | POA: Diagnosis not present

## 2018-02-25 DIAGNOSIS — K7460 Unspecified cirrhosis of liver: Secondary | ICD-10-CM | POA: Diagnosis not present

## 2018-03-08 ENCOUNTER — Ambulatory Visit: Payer: Medicare Other | Attending: Orthopaedic Surgery | Admitting: Physical Therapy

## 2018-03-08 ENCOUNTER — Encounter: Payer: Self-pay | Admitting: Physical Therapy

## 2018-03-08 DIAGNOSIS — M542 Cervicalgia: Secondary | ICD-10-CM | POA: Insufficient documentation

## 2018-03-08 DIAGNOSIS — R293 Abnormal posture: Secondary | ICD-10-CM | POA: Insufficient documentation

## 2018-03-08 NOTE — Therapy (Signed)
[place], [place], [address] Phone: [phone]   Fax:  [phone]  Physical Therapy Treatment / Re-certification  Patient Details  Name: Karen Wilson MRN: [id number] Date of Birth: November 17, 1940 Referring Provider (PT): Ophelia Charter MD   Encounter Date: 03/08/2018  PT End of Session - 03/08/18 1020    Visit Number  5    Number of Visits  13    Date for PT Re-Evaluation  04/05/18    Authorization Type  MCR: Kx mod by 15th visit, Progress not by 10th visit    PT Start Time  1020   pt arrived 5 min late   PT Stop Time  1058    PT Time Calculation (min)  38 min    Activity Tolerance  Patient tolerated treatment well    Behavior During Therapy  [facility] for tasks assessed/performed       Past Medical History:  Diagnosis Date  . Arthritis     History reviewed. No pertinent surgical history.  There were no vitals filed for this visit.  Subjective Assessment - 03/08/18 1020    Subjective  "The neck is almost healed, still have pain inthe lower arm and feels like her L hand with pins/ needles"    Currently in Pain?  Yes    Pain Score  2     Pain Location  Shoulder    Pain Orientation  Left;Lower    Pain Descriptors / Indicators  Pins and needles;Aching;Sore    Pain Type  Chronic pain    Pain Radiating Towards  L elbow/ hand    Pain Onset  More than a month ago    Pain Frequency  Intermittent    Aggravating Factors   unsure    Pain Relieving Factors  exercise, ice/ heat         [facility] PT Assessment - 03/08/18 1020      Assessment   Medical Diagnosis  L neck and shoulder pain      Referring Provider (PT)  Ophelia Charter MD    Hand Dominance  Right      Observation/Other Assessments   Focus on Therapeutic Outcomes (FOTO)   42% limited      AROM   Cervical Flexion  40    Cervical Extension  40    Cervical - Right Side Bend  38    Cervical - Left Side Bend  38    Cervical - Right Rotation  68     Cervical - Left Rotation  68                   [facility] Adult PT Treatment/Exercise - 03/08/18 0001      Exercises   Exercises  Neck      Neck Exercises: Seated   Other Seated Exercise  SNAG exercise 1 x 10 bil    given as HEP, increased time for education of proper form     Neck Exercises: Supine   Neck Retraction  10 reps;5 secs      Manual Therapy   Manual therapy comments  MTRP L upper trap/ levator scapulae     Joint Mobilization  C3-C7 PA and L lateral gapping mobs grade 3-4      Neck Exercises: Stretches   Upper Trapezius Stretch  2 reps;Left;30 seconds    Levator Stretch  2 reps;30 seconds             PT Education - 03/08/18 1101  Education Details  reviewed perviously provided HEP and updated for SNAGS    Person(s) Educated  Patient;Child(ren)    Methods  Explanation;Verbal cues    Comprehension  Verbalized understanding;Verbal cues required       PT Short Term Goals - 03/08/18 1101      PT SHORT TERM GOAL #1   Title  pt to be I with inital HEP     Time  3    Period  Weeks    Status  Achieved      PT SHORT TERM GOAL #2   Title  pt to verbalize and demo proper posture to prevent and reduce neck and LUE referral symptoms    Period  Weeks    Status  Partially Met        PT Long Term Goals - 03/08/18 1102      PT LONG TERM GOAL #1   Title  pt to increase cervical flexion/ extension by >/= 10 degrees and bil sidebending to >/= 30 degrees and bil  Rotation to >/= 60 degrees for functional cervical mobility    Time  6    Period  Weeks    Status  Achieved      PT LONG TERM GOAL #2   Title  pt will report no LUE referral symptoms with cervical movement for >/= 1 week for improvement in condition     Time  6    Period  Weeks    Status  On-going    Target Date  04/05/18      PT LONG TERM GOAL #3   Title  increase FOTO score to </=43% limited to demo improvement in function     Time  6    Period  Weeks    Status  Achieved      PT  LONG TERM GOAL #4   Title  pt will return to normal household ADLs such as vacumming and lifting and carrying activities reporting </= 2/10 pain and no tingling in the L hand    Time  6    Period  Weeks    Status  Not Met    Target Date  04/05/18      PT LONG TERM GOAL #5   Title  pt will be I with all HEP given as of last visit to maintain and progress current level of function     Period  Weeks    Status  On-going    Target Date  04/05/18            Plan - 03/08/18 1058    Clinical Impression Statement  pt conitnuees to make progress with neck ROM and additonally reports pain dropped in the neck to a 1-2/10. She continues to report increased referral into the L UE from the elbow into the hand. She reported relief of referral with cervical mobs and STW. updated HEP for cervical SNAGS and reviewed HEP. plan to continue with current POC for the next 3 weeks working toward remaining goals and independent HEP.     PT Frequency  1x / week    PT Duration  3 weeks    PT Treatment/Interventions  ADLs/Self Care Home Management;Cryotherapy;Traction;Moist Heat;Iontophoresis 61m/ml Dexamethasone;Electrical Stimulation;Ultrasound;Neuromuscular re-education;Therapeutic activities;Therapeutic exercise;Manual techniques;Taping;Passive range of motion;Dry needling;Patient/family education    PT Next Visit Plan  review/ update HEP, cervical mobs, STW upper trap and surrounding musculature, posture education-review scapular bands     PT Home Exercise Plan  upper cervical rotation, upper trap stretch,  chin tucks, scapular retraction. supine yellow band series (except sash) , SNAGS    Consulted and Agree with Plan of Care  Patient       Patient will benefit from skilled therapeutic intervention in order to improve the following deficits and impairments:  Abnormal gait, Pain, Impaired sensation, Increased fascial restricitons, Increased muscle spasms, Decreased activity tolerance, Decreased endurance,  Decreased balance, Postural dysfunction, Improper body mechanics, Decreased range of motion, Decreased strength, Impaired UE functional use  Visit Diagnosis: Cervicalgia  Abnormal posture     Problem List Patient Active Problem List   Diagnosis Date Noted  . Cirrhosis ([place]) 07/03/2017  . Vaccine counseling 07/03/2017  . UNSTEADY GAIT 08/17/2009  . URINARY URGENCY 02/19/2009  . POLYURIA 08/24/2008  . Pain in joint, shoulder region 11/05/2007  . GANGLION CYST, WRIST, RIGHT 11/05/2007  . PALPITATIONS 11/05/2007  . OSTEOPOROSIS 06/12/2007  . GERD 12/24/2006  . IRRITABLE BOWEL SYNDROME 12/24/2006  . Chronic hepatitis C without hepatic coma ([place]) 12/24/2006   Starr Lake PT, DPT, LAT, ATC  03/08/18  11:04 AM      [facility] [address] [place], [place], [address] Phone: [phone]   Fax:  [phone]  Name: Karen Wilson MRN: [id number] Date of Birth: Oct 16, 1940

## 2018-03-19 ENCOUNTER — Ambulatory Visit: Payer: Medicare Other | Admitting: Physical Therapy

## 2018-03-19 ENCOUNTER — Encounter: Payer: Self-pay | Admitting: Physical Therapy

## 2018-03-19 DIAGNOSIS — R293 Abnormal posture: Secondary | ICD-10-CM

## 2018-03-19 DIAGNOSIS — M542 Cervicalgia: Secondary | ICD-10-CM

## 2018-03-19 NOTE — Therapy (Signed)
[place], [place], [address] Phone: [phone]   Fax:  [phone]  Physical Therapy Treatment  Patient Details  Name: Karen Wilson MRN: [id number] Date of Birth: [birth year] Referring Provider (PT): Ophelia Charter MD   Encounter Date: 03/19/2018  PT End of Session - 03/19/18 1123    Visit Number  6    Number of Visits  13    Date for PT Re-Evaluation  04/05/18    Authorization Type  MCR: Kx mod by 15th visit, Progress not by 10th visit    PT Start Time  1100    PT Stop Time  1140    PT Time Calculation (min)  40 min       Past Medical History:  Diagnosis Date  . Arthritis     History reviewed. No pertinent surgical history.  There were no vitals filed for this visit.  Subjective Assessment - 03/19/18 1110    Subjective  I am getting better. 4/10 in lower arm. 2/10 left neck pain with turning head to the left                        [facility] Adult PT Treatment/Exercise - 03/19/18 0001      Neck Exercises: Seated   Other Seated Exercise  scap retract x 10, seated row green band x 20 cues for scap squeeze     Other Seated Exercise  SNAG exercise 1 x 10 bil    given as HEP, increased time for education of proper form     Neck Exercises: Supine   Neck Retraction  10 reps;5 secs      Neck Exercises: Stretches   Upper Trapezius Stretch  2 reps;Left;30 seconds    Upper Trapezius Stretch Limitations  added hand assist for proper form    Other Neck Stretches  book openings x 10 each way                PT Short Term Goals - 03/08/18 1101      PT SHORT TERM GOAL #1   Title  pt to be I with inital HEP     Time  3    Period  Weeks    Status  Achieved      PT SHORT TERM GOAL #2   Title  pt to verbalize and demo proper posture to prevent and reduce neck and LUE referral symptoms    Period  Weeks    Status  Partially Met        PT Long Term Goals - 03/19/18 1141       PT LONG TERM GOAL #1   Title  pt to increase cervical flexion/ extension by >/= 10 degrees and bil sidebending to >/= 30 degrees and bil  Rotation to >/= 60 degrees for functional cervical mobility    Status  Achieved      PT LONG TERM GOAL #2   Title  pt will report no LUE referral symptoms with cervical movement for >/= 1 week for improvement in condition     Baseline  much better, intermittent, less intense    Status  Partially Met      PT LONG TERM GOAL #3   Title  increase FOTO score to </=43% limited to demo improvement in function     Status  Achieved      PT LONG TERM GOAL #4   Title  pt will return to normal  household ADLs such as vacumming and lifting and carrying activities reporting </= 2/10 pain and no tingling in the L hand    Status  Achieved      PT LONG TERM GOAL #5   Title  pt will be I with all HEP given as of last visit to maintain and progress current level of function     Baseline  not consistent     Status  On-going            Plan - 03/19/18 1129    Clinical Impression Statement  Daughter and patient report patient is doing more around the home including self care and sweeping, lifting carrying and vacuming.  She notes less complaints of pain. Pt reports neck pain pain 2/10 and only with left cervical rotation. Her arm pain is only lower arm and rated at 4/10. Intermittent radiation into left arm with cervical rotation. Repeated posture education as she needs cues for good posture. Given green band for supine bands, Reviewed entire HEP. She is not doing them all. LTG# 4 met.     PT Next Visit Plan  review/ update HEP, cervical mobs, STW upper trap and surrounding musculature, posture education-review scapular bands     PT Home Exercise Plan  upper cervical rotation, upper trap stretch, chin tucks, scapular retraction. supine yellow band series (except sash) , SNAGS    Consulted and Agree with Plan of Care  Patient       Patient will benefit from  skilled therapeutic intervention in order to improve the following deficits and impairments:  Abnormal gait, Pain, Impaired sensation, Increased fascial restricitons, Increased muscle spasms, Decreased activity tolerance, Decreased endurance, Decreased balance, Postural dysfunction, Improper body mechanics, Decreased range of motion, Decreased strength, Impaired UE functional use  Visit Diagnosis: Cervicalgia  Abnormal posture     Problem List Patient Active Problem List   Diagnosis Date Noted  . Cirrhosis ([place]) 07/03/2017  . Vaccine counseling 07/03/2017  . UNSTEADY GAIT 08/17/2009  . URINARY URGENCY 02/19/2009  . POLYURIA 08/24/2008  . Pain in joint, shoulder region 11/05/2007  . GANGLION CYST, WRIST, RIGHT 11/05/2007  . PALPITATIONS 11/05/2007  . OSTEOPOROSIS 06/12/2007  . GERD 12/24/2006  . IRRITABLE BOWEL SYNDROME 12/24/2006  . Chronic hepatitis C without hepatic coma ([place]) 12/24/2006    Dorene Ar, PTA 03/19/2018, 11:42 AM  [place], [place], [address] Phone: [phone]   Fax:  [phone]  Name: Amaurie Schreckengost MRN: [id number] Date of Birth: 05/31/1940

## 2018-03-26 ENCOUNTER — Ambulatory Visit: Payer: Medicare Other | Admitting: Physical Therapy

## 2018-04-02 ENCOUNTER — Encounter: Payer: Self-pay | Admitting: Physical Therapy

## 2018-04-02 ENCOUNTER — Ambulatory Visit: Payer: Medicare Other | Admitting: Physical Therapy

## 2018-04-02 DIAGNOSIS — R293 Abnormal posture: Secondary | ICD-10-CM

## 2018-04-02 DIAGNOSIS — M542 Cervicalgia: Secondary | ICD-10-CM

## 2018-04-02 NOTE — Therapy (Signed)
[place], [place], [address] Phone: [phone]   Fax:  [phone]  Physical Therapy Treatment / discharge summary  Patient Details  Name: Karen Wilson MRN: [id number] Date of Birth: September 09, 1940 Referring Provider (PT): Ophelia Charter MD   Encounter Date: 04/02/2018  PT End of Session - 04/02/18 1018    Visit Number  7    Number of Visits  13    Date for PT Re-Evaluation  04/05/18    Authorization Type  MCR: Kx mod by 15th visit, Progress not by 10th visit    PT Start Time  1018    PT Stop Time  1045    PT Time Calculation (min)  27 min    Activity Tolerance  Patient tolerated treatment well    Behavior During Therapy  [facility] for tasks assessed/performed       Past Medical History:  Diagnosis Date  . Arthritis     History reviewed. No pertinent surgical history.  There were no vitals filed for this visit.  Subjective Assessment - 04/02/18 1018    Subjective  "I am doing good, pain 1/10"    Patient Stated Goals  to go back to normal and decrease pain     Currently in Pain?  Yes    Pain Score  1     Pain Location  Shoulder    Pain Orientation  Left    Pain Descriptors / Indicators  Sore    Pain Type  Chronic pain    Pain Onset  More than a month ago    Pain Frequency  Intermittent    Aggravating Factors   unsure         [facility] PT Assessment - 04/02/18 1024      Observation/Other Assessments   Focus on Therapeutic Outcomes (FOTO)   26% limited      AROM   Cervical Flexion  54   initially 40    Cervical Extension  40   initally 10    Cervical - Right Side Bend  38   initally 10   Cervical - Left Side Bend  38   inintally 38   Cervical - Right Rotation  69   initally 50 degrees   Cervical - Left Rotation  67   initially 40 degrees                  [facility] Adult PT Treatment/Exercise - 04/02/18 0001      Neck Exercises: Seated   Other Seated Exercise  row 2 x 10  with blue theraband    Other Seated Exercise  SNAG 1 x 5 bil      Neck Exercises: Stretches   Upper Trapezius Stretch  2 reps;Left;30 seconds             PT Education - 04/02/18 1030    Education Details  reviewed previously provided HEP and importance of continuing exercise. progressing reps/ sets to maxmize endurance     Person(s) Educated  Patient;Child(ren)    Methods  Explanation;Verbal cues    Comprehension  Verbal cues required;Verbalized understanding       PT Short Term Goals - 03/08/18 1101      PT SHORT TERM GOAL #1   Title  pt to be I with inital HEP     Time  3    Period  Weeks    Status  Achieved      PT SHORT TERM GOAL #2  Title  pt to verbalize and demo proper posture to prevent and reduce neck and LUE referral symptoms    Period  Weeks    Status  Partially Met        PT Long Term Goals - 04/02/18 1030      PT LONG TERM GOAL #1   Title  pt to increase cervical flexion/ extension by >/= 10 degrees and bil sidebending to >/= 30 degrees and bil  Rotation to >/= 60 degrees for functional cervical mobility    Time  6    Period  Weeks    Status  Achieved      PT LONG TERM GOAL #2   Title  pt will report no LUE referral symptoms with cervical movement for >/= 1 week for improvement in condition     Baseline  significant improvement but occasional LUE referral    Time  6    Period  Weeks    Status  Partially Met      PT LONG TERM GOAL #3   Title  increase FOTO score to </=43% limited to demo improvement in function     Time  6    Period  Weeks    Status  Achieved      PT LONG TERM GOAL #4   Period  Weeks    Status  Achieved      PT LONG TERM GOAL #5   Title  pt will be I with all HEP given as of last visit to maintain and progress current level of function     Period  Weeks    Status  Achieved            Plan - 04/02/18 1031    Clinical Impression Statement  Karen Wilson has made significant improvement in cervical mobiliyt and  additionally reports 1/10 pain. per her daughter she has returned to doing all housework activities with no signs or complaints of pain. she was able to do all exericses today and met all goals. pt is able to maintain and progress her current level of function independently and will be discharged from PT today.     PT Next Visit Plan  D/C    Consulted and Agree with Plan of Care  Patient       Patient will benefit from skilled therapeutic intervention in order to improve the following deficits and impairments:  Abnormal gait, Pain, Impaired sensation, Increased fascial restricitons, Increased muscle spasms, Decreased activity tolerance, Decreased endurance, Decreased balance, Postural dysfunction, Improper body mechanics, Decreased range of motion, Decreased strength, Impaired UE functional use  Visit Diagnosis: Cervicalgia  Abnormal posture     Problem List Patient Active Problem List   Diagnosis Date Noted  . Cirrhosis ([place]) 07/03/2017  . Vaccine counseling 07/03/2017  . UNSTEADY GAIT 08/17/2009  . URINARY URGENCY 02/19/2009  . POLYURIA 08/24/2008  . Pain in joint, shoulder region 11/05/2007  . GANGLION CYST, WRIST, RIGHT 11/05/2007  . PALPITATIONS 11/05/2007  . OSTEOPOROSIS 06/12/2007  . GERD 12/24/2006  . IRRITABLE BOWEL SYNDROME 12/24/2006  . Chronic hepatitis C without hepatic coma ([place]) 12/24/2006    Starr Lake 04/02/2018, 10:44 AM  [facility] [address] [place], [place], [address] Phone: [phone]   Fax:  [phone]  Name: Karen Wilson MRN: [id number] Date of Birth: 1940/05/03       PHYSICAL THERAPY DISCHARGE SUMMARY  Visits from Start of Care: 7  Current functional level related to goals / functional outcomes:  See goals, FOTO 26% limited   Remaining deficits: Intermittent LUE numbness, but improving with continued exercise reporting 1/10 pain. See above assessment.    Education  / Equipment: HEP, theraband, posture, lifting mechanics,  Plan: Patient agrees to discharge.  Patient goals were met. Patient is being discharged due to being pleased with the current functional level.  ?????        Reinhart Saulters PT, DPT, LAT, ATC  04/02/18  10:44 AM

## 2018-04-25 DIAGNOSIS — M25512 Pain in left shoulder: Secondary | ICD-10-CM | POA: Diagnosis not present

## 2018-05-03 DIAGNOSIS — M25512 Pain in left shoulder: Secondary | ICD-10-CM | POA: Diagnosis not present

## 2018-05-10 DIAGNOSIS — M25512 Pain in left shoulder: Secondary | ICD-10-CM | POA: Diagnosis not present

## 2018-05-30 DIAGNOSIS — Z0000 Encounter for general adult medical examination without abnormal findings: Secondary | ICD-10-CM | POA: Diagnosis not present

## 2018-05-30 DIAGNOSIS — Z1389 Encounter for screening for other disorder: Secondary | ICD-10-CM | POA: Diagnosis not present

## 2018-05-30 DIAGNOSIS — Z8619 Personal history of other infectious and parasitic diseases: Secondary | ICD-10-CM | POA: Diagnosis not present

## 2018-05-30 DIAGNOSIS — H6061 Unspecified chronic otitis externa, right ear: Secondary | ICD-10-CM | POA: Diagnosis not present

## 2018-05-30 DIAGNOSIS — D649 Anemia, unspecified: Secondary | ICD-10-CM | POA: Diagnosis not present

## 2018-05-30 DIAGNOSIS — I10 Essential (primary) hypertension: Secondary | ICD-10-CM | POA: Diagnosis not present

## 2018-05-30 DIAGNOSIS — M8580 Other specified disorders of bone density and structure, unspecified site: Secondary | ICD-10-CM | POA: Diagnosis not present

## 2018-05-30 DIAGNOSIS — R109 Unspecified abdominal pain: Secondary | ICD-10-CM | POA: Diagnosis not present

## 2018-05-31 ENCOUNTER — Other Ambulatory Visit: Payer: Self-pay | Admitting: Internal Medicine

## 2018-05-31 DIAGNOSIS — Z1231 Encounter for screening mammogram for malignant neoplasm of breast: Secondary | ICD-10-CM

## 2018-06-20 DIAGNOSIS — D649 Anemia, unspecified: Secondary | ICD-10-CM | POA: Diagnosis not present

## 2018-07-03 ENCOUNTER — Ambulatory Visit: Payer: Medicare Other

## 2018-08-28 ENCOUNTER — Ambulatory Visit: Payer: Medicare Other

## 2018-09-28 ENCOUNTER — Ambulatory Visit: Payer: Medicare Other

## 2018-11-28 DIAGNOSIS — R109 Unspecified abdominal pain: Secondary | ICD-10-CM | POA: Diagnosis not present

## 2018-11-28 DIAGNOSIS — I10 Essential (primary) hypertension: Secondary | ICD-10-CM | POA: Diagnosis not present

## 2018-11-28 DIAGNOSIS — Z8619 Personal history of other infectious and parasitic diseases: Secondary | ICD-10-CM | POA: Diagnosis not present

## 2018-11-28 DIAGNOSIS — M8580 Other specified disorders of bone density and structure, unspecified site: Secondary | ICD-10-CM | POA: Diagnosis not present

## 2018-11-28 DIAGNOSIS — D649 Anemia, unspecified: Secondary | ICD-10-CM | POA: Diagnosis not present

## 2018-12-02 DIAGNOSIS — R7309 Other abnormal glucose: Secondary | ICD-10-CM | POA: Diagnosis not present

## 2019-01-22 DIAGNOSIS — Z23 Encounter for immunization: Secondary | ICD-10-CM | POA: Diagnosis not present

## 2019-01-30 IMAGING — CR DG CERVICAL SPINE COMPLETE 4+V
6 series · 6 of 6 positions shown · non-contrast
Comparison: No recent.

CLINICAL DATA: Neck pain.  Left arm pain and numbness.  No injury.

EXAM:
CERVICAL SPINE - COMPLETE 4+ VIEW

[w c-spine lat *]
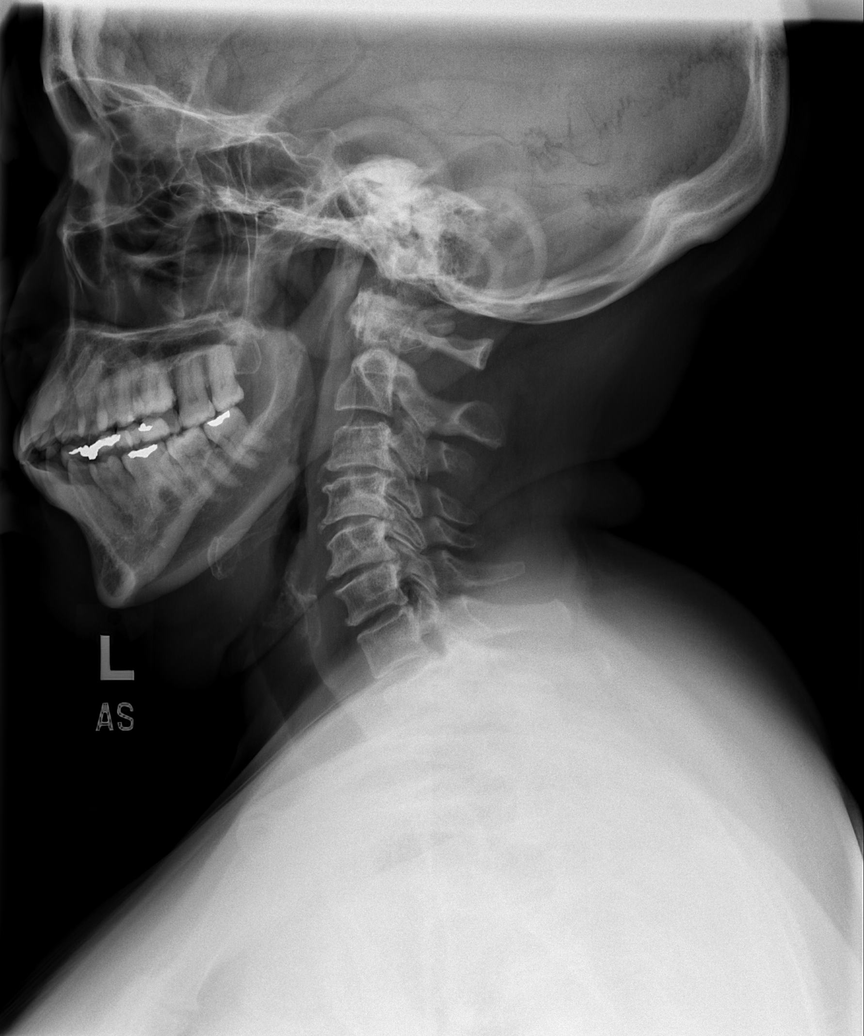

[w c-spine oblique * (1 of 2)]
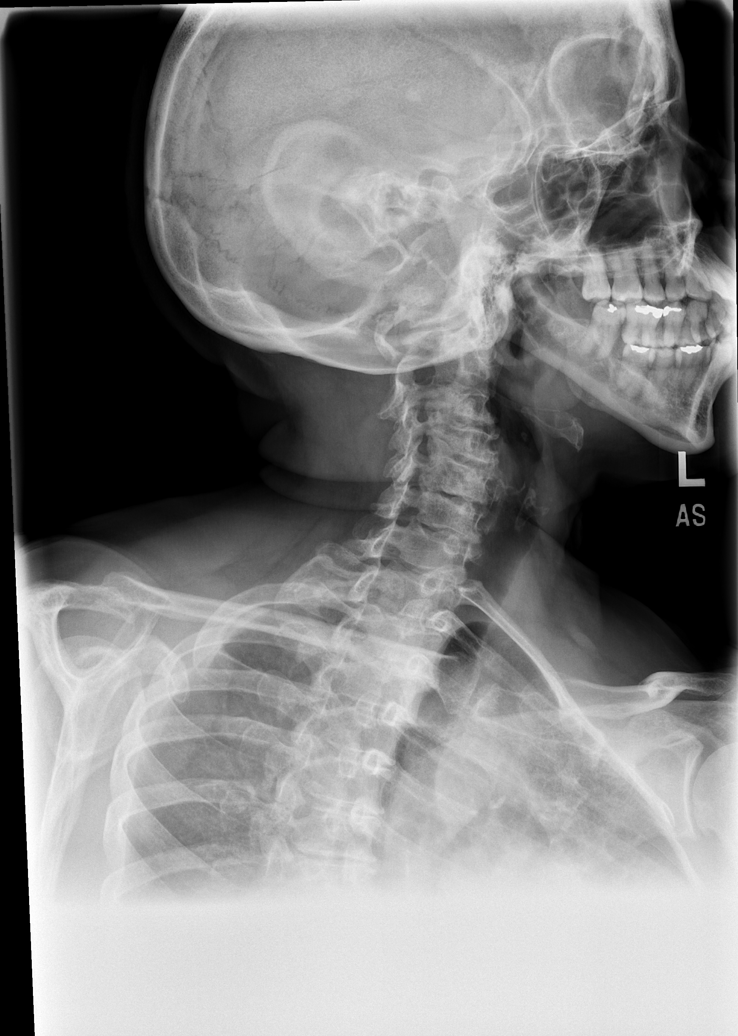

[w c-spine oblique * (2 of 2)]
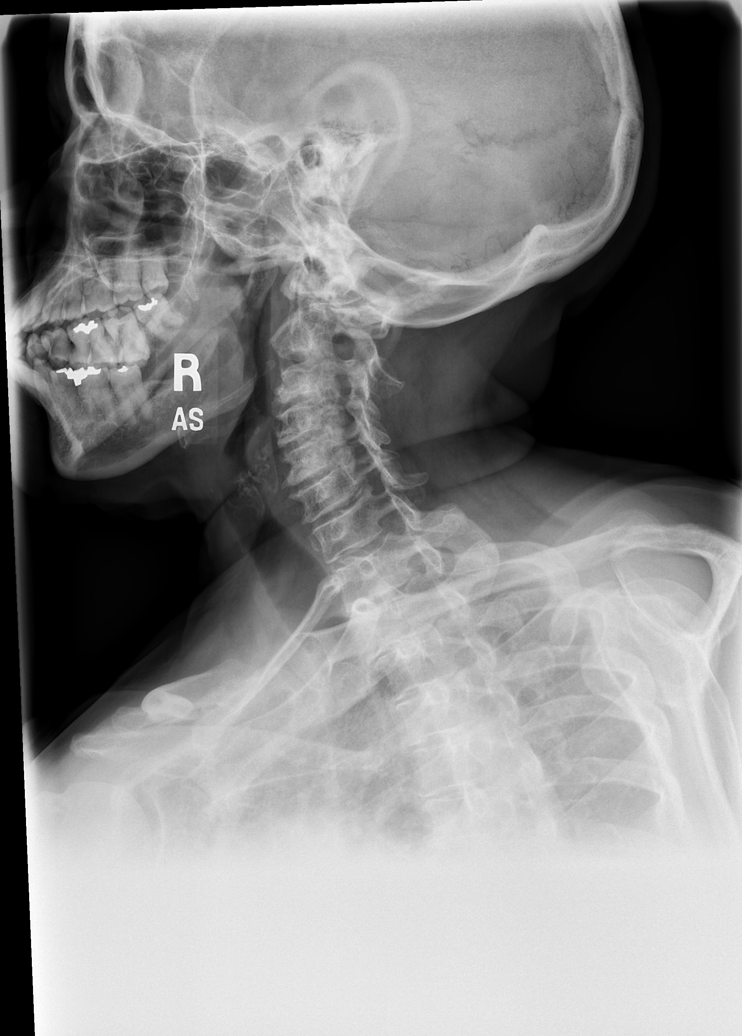

[w c-spine a.p. *]
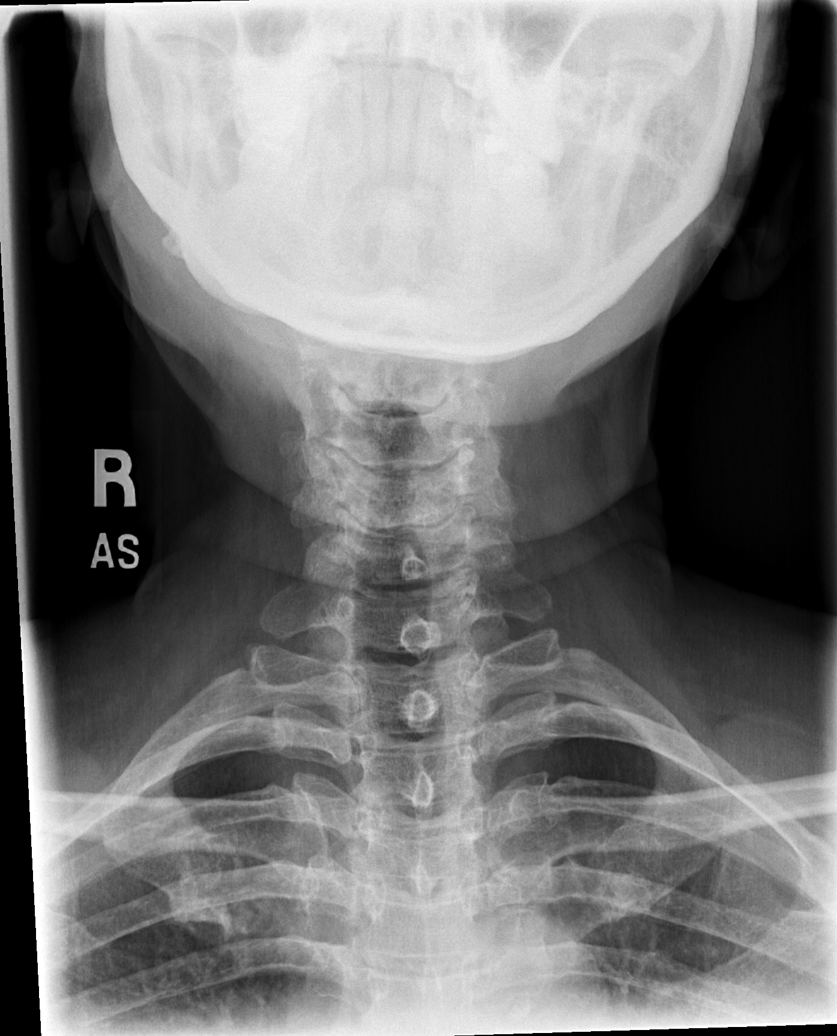

[w c-spine odontoid * (1 of 2)]
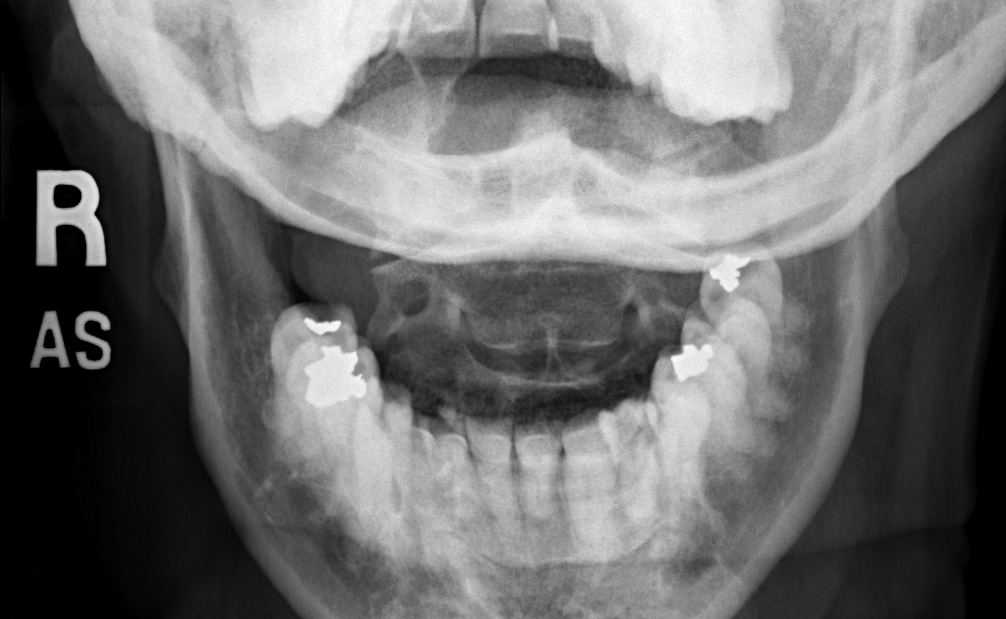

[w c-spine odontoid * (2 of 2)]
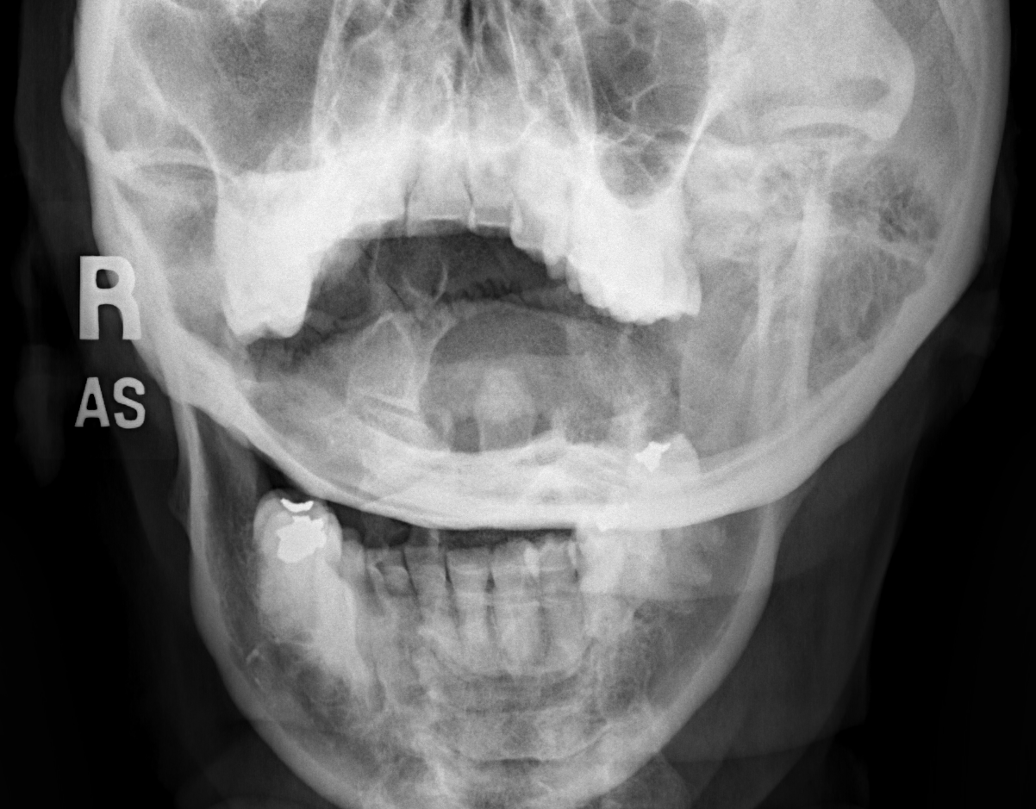

[6 of 6 positions shown; findings below may reference images not displayed]

FINDINGS: Diffuse multilevel degenerative change cervical spine. Degenerative
changes most prominent C3-C4, C4-C5, C5-C6. Multilevel bilateral
neural foraminal narrowing is present. Pulmonary apices are clear.
IMPRESSION: Diffuse multilevel degenerative change. Degenerative changes most
prominent C3-C4, C4-C5, C5-C6. No acute bony abnormality identified.

## 2019-06-01 ENCOUNTER — Ambulatory Visit: Payer: Medicare Other | Attending: Internal Medicine

## 2019-06-01 DIAGNOSIS — Z23 Encounter for immunization: Secondary | ICD-10-CM | POA: Insufficient documentation

## 2019-06-01 NOTE — Progress Notes (Signed)
   Covid-19 Vaccination Clinic  Name:  Karen Wilson    MRN: [id number] DOB: 05-16-1940  06/01/2019  [name] was observed post Covid-19 immunization for 15 minutes without incidence. She was provided with Vaccine Information Sheet and instruction to access the V-Safe system.   [name] was instructed to call 911 with any severe reactions post vaccine: Marland Kitchen Difficulty breathing  . Swelling of your face and throat  . A fast heartbeat  . A bad rash all over your body  . Dizziness and weakness    Immunizations Administered    Name Date Dose VIS Date Route   Pfizer COVID-19 Vaccine 06/01/2019  4:44 PM 0.3 mL 03/14/2019 Intramuscular   Manufacturer: [employer], [employer]   Lot: [id number]   NDC: [address]-6

## 2019-06-06 DIAGNOSIS — I10 Essential (primary) hypertension: Secondary | ICD-10-CM | POA: Diagnosis not present

## 2019-06-06 DIAGNOSIS — Z1389 Encounter for screening for other disorder: Secondary | ICD-10-CM | POA: Diagnosis not present

## 2019-06-06 DIAGNOSIS — Z0000 Encounter for general adult medical examination without abnormal findings: Secondary | ICD-10-CM | POA: Diagnosis not present

## 2019-06-06 DIAGNOSIS — R109 Unspecified abdominal pain: Secondary | ICD-10-CM | POA: Diagnosis not present

## 2019-06-06 DIAGNOSIS — Z8619 Personal history of other infectious and parasitic diseases: Secondary | ICD-10-CM | POA: Diagnosis not present

## 2019-06-06 DIAGNOSIS — E1169 Type 2 diabetes mellitus with other specified complication: Secondary | ICD-10-CM | POA: Diagnosis not present

## 2019-06-30 ENCOUNTER — Ambulatory Visit: Payer: Medicare Other | Attending: Internal Medicine

## 2019-06-30 DIAGNOSIS — Z23 Encounter for immunization: Secondary | ICD-10-CM

## 2019-06-30 NOTE — Progress Notes (Signed)
   Covid-19 Vaccination Clinic  Name:  Karen Wilson    MRN: [id number] DOB: Nov 20, 1940  06/30/2019  [name] was observed post Covid-19 immunization for 15 minutes without incident. She was provided with Vaccine Information Sheet and instruction to access the V-Safe system.   [name] was instructed to call 911 with any severe reactions post vaccine: Marland Kitchen Difficulty breathing  . Swelling of face and throat  . A fast heartbeat  . A bad rash all over body  . Dizziness and weakness   Immunizations Administered    Name Date Dose VIS Date Route   Pfizer COVID-19 Vaccine 06/30/2019 11:14 AM 0.3 mL 03/14/2019 Intramuscular   Manufacturer: [employer], [employer]   Lot: [id number]   NDC: [address]-6

## 2019-07-02 DIAGNOSIS — R109 Unspecified abdominal pain: Secondary | ICD-10-CM | POA: Diagnosis not present

## 2019-11-03 DIAGNOSIS — H8110 Benign paroxysmal vertigo, unspecified ear: Secondary | ICD-10-CM | POA: Diagnosis not present

## 2019-12-09 ENCOUNTER — Other Ambulatory Visit: Payer: Self-pay | Admitting: Internal Medicine

## 2019-12-09 ENCOUNTER — Ambulatory Visit
Admission: RE | Admit: 2019-12-09 | Discharge: 2019-12-09 | Disposition: A | Discharge status: Home / Self Care | Payer: Medicare Other | Source: Ambulatory Visit | Attending: Internal Medicine | Admitting: Internal Medicine

## 2019-12-09 DIAGNOSIS — E1169 Type 2 diabetes mellitus with other specified complication: Secondary | ICD-10-CM | POA: Diagnosis not present

## 2019-12-09 DIAGNOSIS — E663 Overweight: Secondary | ICD-10-CM | POA: Diagnosis not present

## 2019-12-09 DIAGNOSIS — R52 Pain, unspecified: Secondary | ICD-10-CM

## 2019-12-09 DIAGNOSIS — M25511 Pain in right shoulder: Secondary | ICD-10-CM | POA: Diagnosis not present

## 2019-12-09 DIAGNOSIS — I10 Essential (primary) hypertension: Secondary | ICD-10-CM | POA: Diagnosis not present

## 2020-03-15 DIAGNOSIS — Z23 Encounter for immunization: Secondary | ICD-10-CM | POA: Diagnosis not present

## 2020-06-07 DIAGNOSIS — E663 Overweight: Secondary | ICD-10-CM | POA: Diagnosis not present

## 2020-06-07 DIAGNOSIS — K7460 Unspecified cirrhosis of liver: Secondary | ICD-10-CM | POA: Diagnosis not present

## 2020-06-07 DIAGNOSIS — Z1389 Encounter for screening for other disorder: Secondary | ICD-10-CM | POA: Diagnosis not present

## 2020-06-07 DIAGNOSIS — Z8619 Personal history of other infectious and parasitic diseases: Secondary | ICD-10-CM | POA: Diagnosis not present

## 2020-06-07 DIAGNOSIS — M8580 Other specified disorders of bone density and structure, unspecified site: Secondary | ICD-10-CM | POA: Diagnosis not present

## 2020-06-07 DIAGNOSIS — I10 Essential (primary) hypertension: Secondary | ICD-10-CM | POA: Diagnosis not present

## 2020-06-07 DIAGNOSIS — H9209 Otalgia, unspecified ear: Secondary | ICD-10-CM | POA: Diagnosis not present

## 2020-06-07 DIAGNOSIS — E1169 Type 2 diabetes mellitus with other specified complication: Secondary | ICD-10-CM | POA: Diagnosis not present

## 2020-06-07 DIAGNOSIS — Z0000 Encounter for general adult medical examination without abnormal findings: Secondary | ICD-10-CM | POA: Diagnosis not present

## 2020-07-02 DIAGNOSIS — H7201 Central perforation of tympanic membrane, right ear: Secondary | ICD-10-CM | POA: Diagnosis not present

## 2020-07-02 DIAGNOSIS — H905 Unspecified sensorineural hearing loss: Secondary | ICD-10-CM | POA: Diagnosis not present

## 2020-07-05 DIAGNOSIS — H903 Sensorineural hearing loss, bilateral: Secondary | ICD-10-CM | POA: Diagnosis not present

## 2020-08-16 DIAGNOSIS — Z461 Encounter for fitting and adjustment of hearing aid: Secondary | ICD-10-CM | POA: Diagnosis not present

## 2020-08-16 DIAGNOSIS — H6122 Impacted cerumen, left ear: Secondary | ICD-10-CM | POA: Diagnosis not present

## 2020-12-07 DIAGNOSIS — I10 Essential (primary) hypertension: Secondary | ICD-10-CM | POA: Diagnosis not present

## 2020-12-07 DIAGNOSIS — E1169 Type 2 diabetes mellitus with other specified complication: Secondary | ICD-10-CM | POA: Diagnosis not present

## 2020-12-07 DIAGNOSIS — R7309 Other abnormal glucose: Secondary | ICD-10-CM | POA: Diagnosis not present

## 2020-12-07 DIAGNOSIS — M5451 Vertebrogenic low back pain: Secondary | ICD-10-CM | POA: Diagnosis not present

## 2020-12-07 DIAGNOSIS — M8580 Other specified disorders of bone density and structure, unspecified site: Secondary | ICD-10-CM | POA: Diagnosis not present

## 2020-12-07 DIAGNOSIS — Z8619 Personal history of other infectious and parasitic diseases: Secondary | ICD-10-CM | POA: Diagnosis not present

## 2021-02-07 DIAGNOSIS — M79672 Pain in left foot: Secondary | ICD-10-CM | POA: Diagnosis not present

## 2021-02-07 DIAGNOSIS — M2041 Other hammer toe(s) (acquired), right foot: Secondary | ICD-10-CM | POA: Diagnosis not present

## 2021-02-07 DIAGNOSIS — B351 Tinea unguium: Secondary | ICD-10-CM | POA: Diagnosis not present

## 2021-02-07 DIAGNOSIS — E119 Type 2 diabetes mellitus without complications: Secondary | ICD-10-CM | POA: Diagnosis not present

## 2021-02-17 DIAGNOSIS — H10503 Unspecified blepharoconjunctivitis, bilateral: Secondary | ICD-10-CM | POA: Diagnosis not present

## 2021-06-07 DIAGNOSIS — H2513 Age-related nuclear cataract, bilateral: Secondary | ICD-10-CM | POA: Diagnosis not present

## 2021-06-07 DIAGNOSIS — H25013 Cortical age-related cataract, bilateral: Secondary | ICD-10-CM | POA: Diagnosis not present

## 2021-06-07 DIAGNOSIS — H04123 Dry eye syndrome of bilateral lacrimal glands: Secondary | ICD-10-CM | POA: Diagnosis not present

## 2021-06-09 DIAGNOSIS — Z1389 Encounter for screening for other disorder: Secondary | ICD-10-CM | POA: Diagnosis not present

## 2021-06-09 DIAGNOSIS — E663 Overweight: Secondary | ICD-10-CM | POA: Diagnosis not present

## 2021-06-09 DIAGNOSIS — Z8619 Personal history of other infectious and parasitic diseases: Secondary | ICD-10-CM | POA: Diagnosis not present

## 2021-06-09 DIAGNOSIS — Z1231 Encounter for screening mammogram for malignant neoplasm of breast: Secondary | ICD-10-CM | POA: Diagnosis not present

## 2021-06-09 DIAGNOSIS — I10 Essential (primary) hypertension: Secondary | ICD-10-CM | POA: Diagnosis not present

## 2021-06-09 DIAGNOSIS — M8588 Other specified disorders of bone density and structure, other site: Secondary | ICD-10-CM | POA: Diagnosis not present

## 2021-06-09 DIAGNOSIS — Z0000 Encounter for general adult medical examination without abnormal findings: Secondary | ICD-10-CM | POA: Diagnosis not present

## 2021-06-09 DIAGNOSIS — R109 Unspecified abdominal pain: Secondary | ICD-10-CM | POA: Diagnosis not present

## 2021-06-15 ENCOUNTER — Other Ambulatory Visit: Payer: Self-pay | Admitting: Internal Medicine

## 2021-06-15 DIAGNOSIS — Z1231 Encounter for screening mammogram for malignant neoplasm of breast: Secondary | ICD-10-CM

## 2021-06-15 DIAGNOSIS — M8580 Other specified disorders of bone density and structure, unspecified site: Secondary | ICD-10-CM

## 2021-07-04 DIAGNOSIS — R1013 Epigastric pain: Secondary | ICD-10-CM | POA: Diagnosis not present

## 2021-07-04 DIAGNOSIS — R052 Subacute cough: Secondary | ICD-10-CM | POA: Diagnosis not present

## 2021-07-04 DIAGNOSIS — J029 Acute pharyngitis, unspecified: Secondary | ICD-10-CM | POA: Diagnosis not present

## 2021-08-01 DIAGNOSIS — R0989 Other specified symptoms and signs involving the circulatory and respiratory systems: Secondary | ICD-10-CM | POA: Diagnosis not present

## 2021-08-01 DIAGNOSIS — R490 Dysphonia: Secondary | ICD-10-CM | POA: Diagnosis not present

## 2021-09-29 DIAGNOSIS — R490 Dysphonia: Secondary | ICD-10-CM | POA: Diagnosis not present

## 2021-09-29 DIAGNOSIS — K219 Gastro-esophageal reflux disease without esophagitis: Secondary | ICD-10-CM | POA: Diagnosis not present

## 2021-09-29 DIAGNOSIS — R0989 Other specified symptoms and signs involving the circulatory and respiratory systems: Secondary | ICD-10-CM | POA: Diagnosis not present

## 2021-09-29 DIAGNOSIS — J309 Allergic rhinitis, unspecified: Secondary | ICD-10-CM | POA: Diagnosis not present

## 2021-10-11 ENCOUNTER — Other Ambulatory Visit: Payer: Self-pay | Admitting: Internal Medicine

## 2021-10-11 ENCOUNTER — Ambulatory Visit
Admission: RE | Admit: 2021-10-11 | Discharge: 2021-10-11 | Disposition: A | Discharge status: Home / Self Care | Payer: Medicare Other | Source: Ambulatory Visit | Attending: Internal Medicine | Admitting: Internal Medicine

## 2021-10-11 DIAGNOSIS — R109 Unspecified abdominal pain: Secondary | ICD-10-CM

## 2021-10-11 DIAGNOSIS — R1032 Left lower quadrant pain: Secondary | ICD-10-CM | POA: Diagnosis not present

## 2021-10-11 DIAGNOSIS — R1031 Right lower quadrant pain: Secondary | ICD-10-CM | POA: Diagnosis not present

## 2021-11-30 ENCOUNTER — Ambulatory Visit: Payer: Medicare Other

## 2021-11-30 ENCOUNTER — Other Ambulatory Visit: Payer: Medicare Other

## 2021-11-30 ENCOUNTER — Ambulatory Visit
Admission: RE | Admit: 2021-11-30 | Discharge: 2021-11-30 | Disposition: A | Discharge status: Home / Self Care | Payer: Medicare Other | Source: Ambulatory Visit | Attending: Internal Medicine | Admitting: Internal Medicine

## 2021-11-30 DIAGNOSIS — Z1231 Encounter for screening mammogram for malignant neoplasm of breast: Secondary | ICD-10-CM

## 2021-12-02 ENCOUNTER — Other Ambulatory Visit: Payer: Medicare Other

## 2021-12-12 DIAGNOSIS — R7309 Other abnormal glucose: Secondary | ICD-10-CM | POA: Diagnosis not present

## 2021-12-12 DIAGNOSIS — I10 Essential (primary) hypertension: Secondary | ICD-10-CM | POA: Diagnosis not present

## 2021-12-12 DIAGNOSIS — H7290 Unspecified perforation of tympanic membrane, unspecified ear: Secondary | ICD-10-CM | POA: Diagnosis not present

## 2021-12-12 DIAGNOSIS — M25569 Pain in unspecified knee: Secondary | ICD-10-CM | POA: Diagnosis not present

## 2021-12-12 DIAGNOSIS — E663 Overweight: Secondary | ICD-10-CM | POA: Diagnosis not present

## 2022-02-06 DIAGNOSIS — Z23 Encounter for immunization: Secondary | ICD-10-CM | POA: Diagnosis not present

## 2022-05-10 DIAGNOSIS — M25562 Pain in left knee: Secondary | ICD-10-CM | POA: Diagnosis not present

## 2022-05-12 ENCOUNTER — Inpatient Hospital Stay: Admission: RE | Admit: 2022-05-12 | Payer: Medicare Other | Source: Ambulatory Visit

## 2022-05-15 DIAGNOSIS — M25562 Pain in left knee: Secondary | ICD-10-CM | POA: Diagnosis not present

## 2022-05-23 ENCOUNTER — Other Ambulatory Visit: Payer: Self-pay | Admitting: Internal Medicine

## 2022-05-23 DIAGNOSIS — M8580 Other specified disorders of bone density and structure, unspecified site: Secondary | ICD-10-CM

## 2022-06-08 DIAGNOSIS — H25813 Combined forms of age-related cataract, bilateral: Secondary | ICD-10-CM | POA: Diagnosis not present

## 2022-06-13 DIAGNOSIS — E119 Type 2 diabetes mellitus without complications: Secondary | ICD-10-CM | POA: Diagnosis not present

## 2022-06-13 DIAGNOSIS — K5900 Constipation, unspecified: Secondary | ICD-10-CM | POA: Diagnosis not present

## 2022-06-13 DIAGNOSIS — Z5181 Encounter for therapeutic drug level monitoring: Secondary | ICD-10-CM | POA: Diagnosis not present

## 2022-06-13 DIAGNOSIS — D649 Anemia, unspecified: Secondary | ICD-10-CM | POA: Diagnosis not present

## 2022-06-13 DIAGNOSIS — I10 Essential (primary) hypertension: Secondary | ICD-10-CM | POA: Diagnosis not present

## 2022-06-13 DIAGNOSIS — E1169 Type 2 diabetes mellitus with other specified complication: Secondary | ICD-10-CM | POA: Diagnosis not present

## 2022-06-13 DIAGNOSIS — M8580 Other specified disorders of bone density and structure, unspecified site: Secondary | ICD-10-CM | POA: Diagnosis not present

## 2022-06-13 DIAGNOSIS — R109 Unspecified abdominal pain: Secondary | ICD-10-CM | POA: Diagnosis not present

## 2022-06-13 DIAGNOSIS — M25569 Pain in unspecified knee: Secondary | ICD-10-CM | POA: Diagnosis not present

## 2022-06-13 DIAGNOSIS — Z0000 Encounter for general adult medical examination without abnormal findings: Secondary | ICD-10-CM | POA: Diagnosis not present

## 2022-06-19 DIAGNOSIS — D649 Anemia, unspecified: Secondary | ICD-10-CM | POA: Diagnosis not present

## 2022-06-23 ENCOUNTER — Telehealth: Payer: Self-pay | Admitting: *Deleted

## 2022-06-26 ENCOUNTER — Telehealth: Payer: Self-pay | Admitting: *Deleted

## 2022-06-26 NOTE — Patient Outreach (Signed)
  Care Coordination   06/26/2022 Name: Karen Wilson MRN: [id number] DOB: 27-Feb-1941   Care Coordination Outreach Attempts:  An unsuccessful telephone outreach was attempted today to offer the patient information about available care coordination services as a benefit of their health plan.   Follow Up Plan:  Additional outreach attempts will be made to offer the patient care coordination information and services.   Encounter Outcome:  No Answer   Care Coordination Interventions:  No, not indicated    SIG Reya Aurich C. Myrtie Neither, MSN, [facility] Gerontological Nurse Practitioner [facility] Care Management [phone]

## 2022-10-30 ENCOUNTER — Inpatient Hospital Stay: Admission: RE | Admit: 2022-10-30 | Payer: 59 | Source: Ambulatory Visit

## 2022-11-25 DIAGNOSIS — N390 Urinary tract infection, site not specified: Secondary | ICD-10-CM | POA: Diagnosis not present

## 2022-12-12 DIAGNOSIS — H7290 Unspecified perforation of tympanic membrane, unspecified ear: Secondary | ICD-10-CM | POA: Diagnosis not present

## 2022-12-12 DIAGNOSIS — I10 Essential (primary) hypertension: Secondary | ICD-10-CM | POA: Diagnosis not present

## 2022-12-12 DIAGNOSIS — K5900 Constipation, unspecified: Secondary | ICD-10-CM | POA: Diagnosis not present

## 2022-12-12 DIAGNOSIS — M25519 Pain in unspecified shoulder: Secondary | ICD-10-CM | POA: Diagnosis not present

## 2022-12-12 DIAGNOSIS — Z9181 History of falling: Secondary | ICD-10-CM | POA: Diagnosis not present

## 2022-12-12 DIAGNOSIS — E119 Type 2 diabetes mellitus without complications: Secondary | ICD-10-CM | POA: Diagnosis not present

## 2022-12-12 DIAGNOSIS — I700 Atherosclerosis of aorta: Secondary | ICD-10-CM | POA: Diagnosis not present

## 2022-12-12 DIAGNOSIS — K7460 Unspecified cirrhosis of liver: Secondary | ICD-10-CM | POA: Diagnosis not present

## 2022-12-12 DIAGNOSIS — E1169 Type 2 diabetes mellitus with other specified complication: Secondary | ICD-10-CM | POA: Diagnosis not present

## 2022-12-15 ENCOUNTER — Ambulatory Visit
Admission: RE | Admit: 2022-12-15 | Discharge: 2022-12-15 | Disposition: A | Discharge status: Home / Self Care | Payer: 59 | Source: Ambulatory Visit | Attending: Internal Medicine | Admitting: Internal Medicine

## 2022-12-15 ENCOUNTER — Other Ambulatory Visit: Payer: Self-pay | Admitting: Internal Medicine

## 2022-12-15 DIAGNOSIS — M25511 Pain in right shoulder: Secondary | ICD-10-CM

## 2023-05-07 DIAGNOSIS — R109 Unspecified abdominal pain: Secondary | ICD-10-CM | POA: Diagnosis not present

## 2023-05-07 DIAGNOSIS — R350 Frequency of micturition: Secondary | ICD-10-CM | POA: Diagnosis not present

## 2023-06-18 DIAGNOSIS — M25519 Pain in unspecified shoulder: Secondary | ICD-10-CM | POA: Diagnosis not present

## 2023-06-18 DIAGNOSIS — Z5181 Encounter for therapeutic drug level monitoring: Secondary | ICD-10-CM | POA: Diagnosis not present

## 2023-06-18 DIAGNOSIS — D649 Anemia, unspecified: Secondary | ICD-10-CM | POA: Diagnosis not present

## 2023-06-18 DIAGNOSIS — R109 Unspecified abdominal pain: Secondary | ICD-10-CM | POA: Diagnosis not present

## 2023-06-18 DIAGNOSIS — M8580 Other specified disorders of bone density and structure, unspecified site: Secondary | ICD-10-CM | POA: Diagnosis not present

## 2023-06-18 DIAGNOSIS — I10 Essential (primary) hypertension: Secondary | ICD-10-CM | POA: Diagnosis not present

## 2023-06-18 DIAGNOSIS — Z23 Encounter for immunization: Secondary | ICD-10-CM | POA: Diagnosis not present

## 2023-06-18 DIAGNOSIS — I700 Atherosclerosis of aorta: Secondary | ICD-10-CM | POA: Diagnosis not present

## 2023-06-18 DIAGNOSIS — Z0000 Encounter for general adult medical examination without abnormal findings: Secondary | ICD-10-CM | POA: Diagnosis not present

## 2023-06-18 DIAGNOSIS — K7460 Unspecified cirrhosis of liver: Secondary | ICD-10-CM | POA: Diagnosis not present

## 2023-06-18 DIAGNOSIS — E1169 Type 2 diabetes mellitus with other specified complication: Secondary | ICD-10-CM | POA: Diagnosis not present

## 2023-06-18 DIAGNOSIS — K5900 Constipation, unspecified: Secondary | ICD-10-CM | POA: Diagnosis not present

## 2023-06-19 ENCOUNTER — Other Ambulatory Visit: Payer: Self-pay | Admitting: Internal Medicine

## 2023-06-19 DIAGNOSIS — M8580 Other specified disorders of bone density and structure, unspecified site: Secondary | ICD-10-CM

## 2023-06-26 DIAGNOSIS — D649 Anemia, unspecified: Secondary | ICD-10-CM | POA: Diagnosis not present

## 2023-06-29 DIAGNOSIS — R109 Unspecified abdominal pain: Secondary | ICD-10-CM | POA: Diagnosis not present

## 2023-06-29 DIAGNOSIS — D509 Iron deficiency anemia, unspecified: Secondary | ICD-10-CM | POA: Diagnosis not present

## 2023-06-29 DIAGNOSIS — R195 Other fecal abnormalities: Secondary | ICD-10-CM | POA: Diagnosis not present

## 2023-07-27 ENCOUNTER — Other Ambulatory Visit: Payer: Self-pay | Admitting: Student

## 2023-07-27 DIAGNOSIS — R109 Unspecified abdominal pain: Secondary | ICD-10-CM

## 2023-07-30 ENCOUNTER — Ambulatory Visit
Admission: RE | Admit: 2023-07-30 | Discharge: 2023-07-30 | Disposition: A | Discharge status: Home / Self Care | Source: Ambulatory Visit | Attending: Student

## 2023-07-30 DIAGNOSIS — R109 Unspecified abdominal pain: Secondary | ICD-10-CM

## 2023-07-30 MED ORDER — IOPAMIDOL (ISOVUE-300) INJECTION 61%
80.0000 mL | Freq: Once | INTRAVENOUS | Status: AC | PRN
  Administered 2023-07-30: 80 mL via INTRAVENOUS

## 2023-12-14 DIAGNOSIS — D509 Iron deficiency anemia, unspecified: Secondary | ICD-10-CM | POA: Diagnosis not present

## 2023-12-14 DIAGNOSIS — R109 Unspecified abdominal pain: Secondary | ICD-10-CM | POA: Diagnosis not present

## 2023-12-17 DIAGNOSIS — K7460 Unspecified cirrhosis of liver: Secondary | ICD-10-CM | POA: Diagnosis not present

## 2023-12-17 DIAGNOSIS — D649 Anemia, unspecified: Secondary | ICD-10-CM | POA: Diagnosis not present

## 2023-12-17 DIAGNOSIS — E1169 Type 2 diabetes mellitus with other specified complication: Secondary | ICD-10-CM | POA: Diagnosis not present

## 2023-12-17 DIAGNOSIS — R350 Frequency of micturition: Secondary | ICD-10-CM | POA: Diagnosis not present

## 2023-12-17 DIAGNOSIS — I700 Atherosclerosis of aorta: Secondary | ICD-10-CM | POA: Diagnosis not present

## 2023-12-17 DIAGNOSIS — I10 Essential (primary) hypertension: Secondary | ICD-10-CM | POA: Diagnosis not present

## 2023-12-17 DIAGNOSIS — M8580 Other specified disorders of bone density and structure, unspecified site: Secondary | ICD-10-CM | POA: Diagnosis not present

## 2023-12-18 DIAGNOSIS — R350 Frequency of micturition: Secondary | ICD-10-CM | POA: Diagnosis not present

## 2024-02-20 ENCOUNTER — Other Ambulatory Visit

## 2024-03-24 ENCOUNTER — Other Ambulatory Visit (HOSPITAL_BASED_OUTPATIENT_CLINIC_OR_DEPARTMENT_OTHER)

## 2024-06-02 ENCOUNTER — Other Ambulatory Visit (HOSPITAL_BASED_OUTPATIENT_CLINIC_OR_DEPARTMENT_OTHER)
